# Patient Record
Sex: Male | Born: 1950
Health system: Southern US, Community
[De-identification: ages and names within clinical notes are randomized; demographics above are authoritative.]

## PROBLEM LIST (undated history)

## (undated) DIAGNOSIS — G4733 Obstructive sleep apnea (adult) (pediatric): Secondary | ICD-10-CM

## (undated) HISTORY — DX: Obstructive sleep apnea (adult) (pediatric): G47.33

---

## 2005-06-22 HISTORY — PX: COLONOSCOPY: SHX174

## 2011-06-09 ENCOUNTER — Ambulatory Visit (HOSPITAL_COMMUNITY)
Admission: EM | Admit: 2011-06-09 | Discharge: 2011-06-09 | Disposition: A | Discharge status: Home / Self Care | Payer: BC Managed Care – PPO | Attending: Internal Medicine | Admitting: Internal Medicine

## 2011-06-09 ENCOUNTER — Encounter: Payer: Self-pay | Admitting: Emergency Medicine

## 2011-06-09 ENCOUNTER — Encounter (HOSPITAL_COMMUNITY): Admission: EM | Disposition: A | Discharge status: Home / Self Care | Payer: Self-pay | Source: Home / Self Care

## 2011-06-09 DIAGNOSIS — R1310 Dysphagia, unspecified: Secondary | ICD-10-CM

## 2011-06-09 DIAGNOSIS — IMO0002 Reserved for concepts with insufficient information to code with codable children: Secondary | ICD-10-CM | POA: Insufficient documentation

## 2011-06-09 DIAGNOSIS — K222 Esophageal obstruction: Secondary | ICD-10-CM

## 2011-06-09 DIAGNOSIS — T18108A Unspecified foreign body in esophagus causing other injury, initial encounter: Secondary | ICD-10-CM

## 2011-06-09 DIAGNOSIS — K449 Diaphragmatic hernia without obstruction or gangrene: Secondary | ICD-10-CM | POA: Insufficient documentation

## 2011-06-09 HISTORY — PX: ESOPHAGOGASTRODUODENOSCOPY: SHX5428

## 2011-06-09 SURGERY — EGD (ESOPHAGOGASTRODUODENOSCOPY)
Anesthesia: Moderate Sedation

## 2011-06-09 MED ORDER — SODIUM CHLORIDE 0.9 % IV SOLN
INTRAVENOUS | Status: DC
  Administered 2011-06-09: 05:00:00 via INTRAVENOUS

## 2011-06-09 MED ORDER — GLUCAGON HCL (RDNA) 1 MG IJ SOLR
1.0000 mg | Freq: Once | INTRAMUSCULAR | Status: AC
  Administered 2011-06-09: 1 mg via INTRAVENOUS
  Filled 2011-06-09: qty 1

## 2011-06-09 MED ORDER — GLUCAGON HCL (RDNA) 1 MG IJ SOLR
INTRAMUSCULAR | Status: AC
  Administered 2011-06-09: 1 mg
  Filled 2011-06-09: qty 1

## 2011-06-09 MED ORDER — BUTAMBEN-TETRACAINE-BENZOCAINE 2-2-14 % EX AERO
INHALATION_SPRAY | CUTANEOUS | Status: DC | PRN
  Administered 2011-06-09: 2 via TOPICAL

## 2011-06-09 MED ORDER — FENTANYL NICU IV SYRINGE 50 MCG/ML
INJECTION | INTRAMUSCULAR | Status: DC | PRN
  Administered 2011-06-09 (×4): 25 ug via INTRAVENOUS

## 2011-06-09 MED ORDER — MIDAZOLAM HCL 10 MG/2ML IJ SOLN
INTRAMUSCULAR | Status: DC | PRN
  Administered 2011-06-09 (×4): 2 mg via INTRAVENOUS
  Administered 2011-06-09: 1 mg via INTRAVENOUS

## 2011-06-09 NOTE — ED Notes (Signed)
Pt states he ate steak last night for supper and felt as though it got stuck in his throat but for the past 2 hrs it has been much worse  Pt states it is hard to breathe  Pt states he had small amt of vomiting without relief

## 2011-06-09 NOTE — H&P (Signed)
  HPI: This is a very pleasant man with likely eosphageal food impaction, steak from last nights dinner at [employer]    History reviewed. No pertinent past medical history.  History reviewed. No pertinent past surgical history.  Current Facility-Administered Medications  Medication Dose Route Frequency Provider Last Rate Last Dose  . 0.9 %  sodium chloride infusion   Intravenous Continuous Flint Melter, MD 125 mL/hr at 06/09/11 0525    . glucagon (GLUCAGEN) 1 MG injection        1 mg at 06/09/11 0558  . glucagon (GLUCAGEN) injection 1 mg  1 mg Intravenous Once Flint Melter, MD   1 mg at 06/09/11 1610   Current Outpatient Prescriptions  Medication Sig Dispense Refill  . cetirizine (ZYRTEC) 10 MG tablet Take 10 mg by mouth daily.        . Multiple Vitamins-Minerals (MULTIVITAMIN WITH MINERALS) tablet Take 1 tablet by mouth daily.        Marland Kitchen omeprazole (PRILOSEC) 20 MG capsule Take 20 mg by mouth as needed.          Allergies as of 06/09/2011 - Review Complete 06/09/2011  Allergen Reaction Noted  . Amoxicillin  06/09/2011    History reviewed. No pertinent family history.  History   Social History  . Marital Status: Married    Spouse Name: N/A    Number of Children: N/A  . Years of Education: N/A   Occupational History  . Not on file.   Social History Main Topics  . Smoking status: Never Smoker   . Smokeless tobacco: Not on file  . Alcohol Use: Yes  . Drug Use: No  . Sexually Active:    Other Topics Concern  . Not on file   Social History Narrative  . No narrative on file      Physical Exam: BP 116/78  Pulse 86  Temp(Src) 97.8 F (36.6 C) (Oral)  Resp 14  SpO2 100% Constitutional: generally well-appearing Psychiatric: alert and oriented x3 Abdomen: soft, nontender, nondistended, no obvious ascites, no peritoneal signs, normal bowel sounds     Assessment and plan: 60 y.o. male with likely esophageal food impaction  He identifies Dr. Matthias Hughs  as his gastroenterologist (recent office visits) but ED called [facility] this morning.  I spoke with Dr. Evette Cristal who is fine with me proceeding with the EGD now.

## 2011-06-09 NOTE — ED Notes (Signed)
Pt transported to endo for procedure.

## 2011-06-09 NOTE — Op Note (Signed)
[facility] [address] [place], [place]  [address]  ENDOSCOPY PROCEDURE REPORT  PATIENT:  Joseph Henderson, Joseph Henderson  MR#:  [id number] BIRTHDATE:  [birth year], 60 yrs. old  GENDER:  male ENDOSCOPIST:  Rachael Fee, MD PROCEDURE DATE:  06/09/2011 PROCEDURE:  EGD with foreign body removal ASA CLASS:  Class II INDICATIONS:  food impaction, was eating steak last night at [employer]  Primary GI MD Dr. Matthias Hughs however [facility] called by ER this AM.  Dr. Christella Hartigan discussed with Dr. Evette Cristal and then proceeded with EGD MEDICATIONS:  Fentanyl 100 mcg IV, Versed 9 mg IV TOPICAL ANESTHETIC:  Cetacaine Spray  DESCRIPTION OF PROCEDURE:   After the risks benefits and alternatives of the procedure were thoroughly explained, informed consent was obtained.  The  endoscope was introduced through the mouth and advanced to the stomach body, without limitations.  The instrument was slowly withdrawn as the mucosa was fully examined. <<PROCEDUREIMAGES>> There was a large, solid bolus of steak at GE junction. I tried to gently push the bolus into his stomach without success. I then attempted to remove it with Lucina Mellow Net but this was not helpful and so I changed to four-pronged grasper and was able to removed the bolus in a single piece from the esophagus. There was a benign appearing, focal stricture at GE junction with minor superficial mucosal tear at the site. Dilation was not performed (see image2, image5, and image6).  A hiatal hernia was found (see image4 and image3).    Retroflexed views revealed no abnormalities.    The scope was then withdrawn from the patient and the procedure completed. COMPLICATIONS:  None  ENDOSCOPIC IMPRESSION: 1) Steak bolus impacted at GE junction above a benign, focal (peptic appearing) stricture.  This was removed, stricture was not dilated. 2) Hiatal hernia  RECOMMENDATIONS: Please call Dr. Donavan Burnet office for an appt to see him within the next  2-3 weeks. You should stay on TWICE daily prevacid for now (best taken 20-30 min prior to meals). You need to chew very well, eat slowly and take small bites for now.  ______________________________ Rachael Fee, MD  n. eSIGNED:   Rachael Fee at 06/09/2011 09:49 AM  [name], [phone]

## 2011-06-09 NOTE — ED Provider Notes (Signed)
History     CSN: [id number] Arrival date & time: 06/09/2011  3:43 AM   First MD Initiated Contact with Patient 06/09/11 [phone]      Chief Complaint  Patient presents with  . Airway Obstruction    (Consider location/radiation/quality/duration/timing/severity/associated sxs/prior treatment) The history is provided by the patient and the spouse.   Patient was eating steak at 7 PM last evening, when he felt something get stuck in his upper chest area. Since that time. He has been unable to eat or drink anything. He reports spitting saliva as well. His wife tried the Heimlich maneuver on him without success. He has not had any trouble breathing, cough, or color change. He has no chest pain, fever, or weakness. He takes Prilosec sporadically to help prevent problems like this. He has never had esophageal obstruction, requiring extraction. He thinks he had an endoscopy 2 years ago at the time he had a colonoscopy, but is unclear about the results.  History reviewed. No pertinent past medical history.  History reviewed. No pertinent past surgical history.  History reviewed. No pertinent family history.  History  Substance Use Topics  . Smoking status: Never Smoker   . Smokeless tobacco: Not on file  . Alcohol Use: Yes      Review of Systems  All other systems reviewed and are negative.    Allergies  Amoxicillin  Home Medications   Current Outpatient Rx  Name Route Sig Dispense Refill  . CETIRIZINE HCL 10 MG PO TABS Oral Take 10 mg by mouth daily.      . MULTI-VITAMIN/MINERALS PO TABS Oral Take 1 tablet by mouth daily.      Marland Kitchen OMEPRAZOLE 20 MG PO CPDR Oral Take 20 mg by mouth as needed.        BP 116/78  Pulse 86  Temp(Src) 97.8 F (36.6 C) (Oral)  Resp 14  SpO2 100%  Physical Exam  Constitutional: He is oriented to person, place, and time. He appears well-developed and well-nourished.       At initial evaluation, he is not spitting saliva  HENT:  Head: Normocephalic  and atraumatic.  Eyes: EOM are normal. Pupils are equal, round, and reactive to light.  Neck: Normal range of motion. Neck supple.  Cardiovascular: Normal rate and regular rhythm.   Pulmonary/Chest: Effort normal and breath sounds normal.  Abdominal: Soft. Bowel sounds are normal.  Musculoskeletal: Normal range of motion.  Neurological: He is alert and oriented to person, place, and time. No cranial nerve deficit. He exhibits normal muscle tone. Coordination normal.  Skin: Skin is warm and dry. No erythema.  Psychiatric: He has a normal mood and affect. His behavior is normal. Judgment and thought content normal.    ED Course  Procedures (including critical care time) Emergency department treatment: IV fluids. Glucagon x2. Patient did not have improvement in symptoms of foreign body sensation in his esophagus. He did began to expectorate saliva in a container so we could visualize. He did not tolerate attempts to swallow water. I contacted the on-call gastroenterologist for his physician, who agreed to evaluate the patient and remove a foreign body if present. Labs Reviewed - No data to display No results found.   1. Esophageal foreign body       MDM  Patient needs urgent endoscopy to remove an esophageal foreign body       Flint Melter, MD 06/09/11 [phone]

## 2011-06-10 ENCOUNTER — Encounter (HOSPITAL_COMMUNITY): Payer: Self-pay

## 2011-06-10 ENCOUNTER — Encounter (HOSPITAL_COMMUNITY): Payer: Self-pay | Admitting: Gastroenterology

## 2011-07-26 ENCOUNTER — Ambulatory Visit: Payer: BC Managed Care – PPO | Admitting: Family Medicine

## 2011-07-26 VITALS — BP 119/67 | HR 69 | Temp 97.9°F | Resp 16 | Ht 74.0 in | Wt 201.0 lb

## 2011-07-26 DIAGNOSIS — K219 Gastro-esophageal reflux disease without esophagitis: Secondary | ICD-10-CM | POA: Insufficient documentation

## 2011-07-26 DIAGNOSIS — T7840XA Allergy, unspecified, initial encounter: Secondary | ICD-10-CM

## 2011-07-26 MED ORDER — METHYLPREDNISOLONE ACETATE 80 MG/ML IJ SUSP
80.0000 mg | Freq: Once | INTRAMUSCULAR | Status: AC
  Administered 2011-07-26: 80 mg via INTRAMUSCULAR

## 2011-07-26 NOTE — Patient Instructions (Signed)
Poison Ivy Poison ivy is a inflammation of the skin (contact dermatitis) caused by touching the allergens on the leaves of the ivy plant following previous exposure to the plant. The rash usually appears 48 hours after exposure. The rash is usually bumps (papules) or blisters (vesicles) in a linear pattern. Depending on your own sensitivity, the rash may simply cause redness and itching, or it may also progress to blisters which may break open. These must be well cared for to prevent secondary bacterial (germ) infection, followed by scarring. Keep any open areas dry, clean, dressed, and covered with an antibacterial ointment if needed. The eyes may also get puffy. The puffiness is worst in the morning and gets better as the day progresses. This dermatitis usually heals without scarring, within 2 to 3 weeks without treatment. HOME CARE INSTRUCTIONS  Thoroughly wash with soap and water as soon as you have been exposed to poison ivy. You have about one half hour to remove the plant resin before it will cause the rash. This washing will destroy the oil or antigen on the skin that is causing, or will cause, the rash. Be sure to wash under your fingernails as any plant resin there will continue to spread the rash. Do not rub skin vigorously when washing affected area. Poison ivy cannot spread if no oil from the plant remains on your body. A rash that has progressed to weeping sores will not spread the rash unless you have not washed thoroughly. It is also important to wash any clothes you have been wearing as these may carry active allergens. The rash will return if you wear the unwashed clothing, even several days later. Avoidance of the plant in the future is the best measure. Poison ivy plant can be recognized by the number of leaves. Generally, poison ivy has three leaves with flowering branches on a single stem. Diphenhydramine may be purchased over the counter and used as needed for itching. Do not drive with  this medication if it makes you drowsy.Ask your caregiver about medication for children. SEEK MEDICAL CARE IF:  Open sores develop.   Redness spreads beyond area of rash.   You notice purulent (pus-like) discharge.   You have increased pain.   Other signs of infection develop (such as fever).  Document Released: 06/05/2000 Document Revised: 02/18/2011 Document Reviewed: 04/24/2009 ExitCare Patient Information 2012 [employer]. 

## 2011-07-26 NOTE — Progress Notes (Signed)
Joseph Henderson is a 61 year old gentleman who comes in complaining of facial swelling. He was burning brush in his backyard yesterday, and he has a history of poison ivy allergy. In concert exactly similar to what he's had in the past with an exposed to poison ivy.   Mild periorbital swelling oropharynx clear neck supple no adenopathy and chest is clear.  Assessment: Acute allergic reaction to rhus.

## 2012-03-17 ENCOUNTER — Ambulatory Visit (INDEPENDENT_AMBULATORY_CARE_PROVIDER_SITE_OTHER): Payer: BC Managed Care – PPO

## 2012-03-17 DIAGNOSIS — Z23 Encounter for immunization: Secondary | ICD-10-CM

## 2013-03-14 ENCOUNTER — Ambulatory Visit (INDEPENDENT_AMBULATORY_CARE_PROVIDER_SITE_OTHER): Payer: BC Managed Care – PPO | Admitting: Family Medicine

## 2013-03-14 DIAGNOSIS — Z23 Encounter for immunization: Secondary | ICD-10-CM

## 2014-03-05 ENCOUNTER — Ambulatory Visit (INDEPENDENT_AMBULATORY_CARE_PROVIDER_SITE_OTHER): Payer: BC Managed Care – PPO

## 2014-03-05 DIAGNOSIS — Z23 Encounter for immunization: Secondary | ICD-10-CM

## 2015-02-11 ENCOUNTER — Ambulatory Visit (INDEPENDENT_AMBULATORY_CARE_PROVIDER_SITE_OTHER): Payer: BLUE CROSS/BLUE SHIELD | Admitting: Internal Medicine

## 2015-02-11 VITALS — BP 112/68 | HR 64 | Temp 97.9°F | Resp 16 | Ht 73.0 in | Wt 211.4 lb

## 2015-02-11 DIAGNOSIS — R05 Cough: Secondary | ICD-10-CM | POA: Diagnosis not present

## 2015-02-11 DIAGNOSIS — R059 Cough, unspecified: Secondary | ICD-10-CM

## 2015-02-11 DIAGNOSIS — J22 Unspecified acute lower respiratory infection: Secondary | ICD-10-CM

## 2015-02-11 DIAGNOSIS — J988 Other specified respiratory disorders: Secondary | ICD-10-CM

## 2015-02-11 MED ORDER — AZITHROMYCIN 250 MG PO TABS: ORAL_TABLET | ORAL | Status: DC

## 2015-02-11 MED ORDER — HYDROCODONE-HOMATROPINE 5-1.5 MG/5ML PO SYRP: 5.0000 mL | ORAL_SOLUTION | Freq: Four times a day (QID) | ORAL | Status: DC | PRN

## 2015-02-11 NOTE — Progress Notes (Signed)
   Subjective:    Patient ID: Joseph Henderson, male    DOB: 09-Sep-1950, 63 y.o.   MRN: [id number]  HPI cough and chest congestion for one week and worse Productive in the morning Cough interrupts sleep No fever or no nasal congestion no sore throat Wife with the same illness and has responded to Zithromax  Leaving for [place] tomorrow    Review of Systems Noncontributory    Objective:   Physical Exam BP 112/68 mmHg  Pulse 64  Temp(Src) 97.9 F (36.6 C) (Oral)  Resp 16  Ht  (1.854 m)  Wt 211 lb 6.4 oz (95.89 kg)  BMI 27.90 kg/m2  SpO2 98% Conjunctiva clear TMs clear Nares boggy with clear rhinorrhea Throat clear No cervical lymphadenopathy Chest with scattered rhonchi bilaterally that clear with coughing No wheezing or delay in expiration with forced expiration Heart regular without murmur   Friend Joseph Henderson grape    Assessment & Plan:  Cough  Lower respiratory infection  Meds ordered this encounter  Medications  . azithromycin (ZITHROMAX) 250 MG tablet    Sig: As packaged    Dispense:  6 tablet    Refill:  0  . HYDROcodone-homatropine (HYCODAN) 5-1.5 MG/5ML syrup    Sig: Take 5 mLs by mouth every 6 (six) hours as needed.    Dispense:  80 mL    RefiMathayus Stanbery Needs less than 3 oz to fly   Afrin before flying

## 2015-03-18 ENCOUNTER — Ambulatory Visit (INDEPENDENT_AMBULATORY_CARE_PROVIDER_SITE_OTHER): Payer: BLUE CROSS/BLUE SHIELD | Admitting: *Deleted

## 2015-03-18 DIAGNOSIS — Z23 Encounter for immunization: Secondary | ICD-10-CM | POA: Diagnosis not present

## 2016-03-06 ENCOUNTER — Ambulatory Visit (INDEPENDENT_AMBULATORY_CARE_PROVIDER_SITE_OTHER): Payer: Medicare Other

## 2016-03-06 DIAGNOSIS — Z23 Encounter for immunization: Secondary | ICD-10-CM | POA: Diagnosis not present

## 2016-07-14 DIAGNOSIS — D485 Neoplasm of uncertain behavior of skin: Secondary | ICD-10-CM | POA: Diagnosis not present

## 2016-07-14 DIAGNOSIS — L570 Actinic keratosis: Secondary | ICD-10-CM | POA: Diagnosis not present

## 2016-07-14 DIAGNOSIS — D225 Melanocytic nevi of trunk: Secondary | ICD-10-CM | POA: Diagnosis not present

## 2016-07-14 DIAGNOSIS — Z86018 Personal history of other benign neoplasm: Secondary | ICD-10-CM | POA: Diagnosis not present

## 2016-07-14 DIAGNOSIS — D2262 Melanocytic nevi of left upper limb, including shoulder: Secondary | ICD-10-CM | POA: Diagnosis not present

## 2016-07-14 DIAGNOSIS — L821 Other seborrheic keratosis: Secondary | ICD-10-CM | POA: Diagnosis not present

## 2016-07-14 DIAGNOSIS — Z23 Encounter for immunization: Secondary | ICD-10-CM | POA: Diagnosis not present

## 2016-07-14 DIAGNOSIS — Z85828 Personal history of other malignant neoplasm of skin: Secondary | ICD-10-CM | POA: Diagnosis not present

## 2016-08-25 DIAGNOSIS — D485 Neoplasm of uncertain behavior of skin: Secondary | ICD-10-CM | POA: Diagnosis not present

## 2016-08-25 DIAGNOSIS — L988 Other specified disorders of the skin and subcutaneous tissue: Secondary | ICD-10-CM | POA: Diagnosis not present

## 2017-01-12 DIAGNOSIS — Z13220 Encounter for screening for lipoid disorders: Secondary | ICD-10-CM | POA: Diagnosis not present

## 2017-01-12 DIAGNOSIS — Z0000 Encounter for general adult medical examination without abnormal findings: Secondary | ICD-10-CM | POA: Diagnosis not present

## 2017-01-12 DIAGNOSIS — K219 Gastro-esophageal reflux disease without esophagitis: Secondary | ICD-10-CM | POA: Diagnosis not present

## 2017-01-12 DIAGNOSIS — Z125 Encounter for screening for malignant neoplasm of prostate: Secondary | ICD-10-CM | POA: Diagnosis not present

## 2017-01-20 DIAGNOSIS — J069 Acute upper respiratory infection, unspecified: Secondary | ICD-10-CM | POA: Diagnosis not present

## 2017-03-09 ENCOUNTER — Ambulatory Visit (INDEPENDENT_AMBULATORY_CARE_PROVIDER_SITE_OTHER): Payer: Medicare Other | Admitting: Physician Assistant

## 2017-03-09 DIAGNOSIS — Z23 Encounter for immunization: Secondary | ICD-10-CM | POA: Diagnosis not present

## 2017-03-09 NOTE — Progress Notes (Signed)
Need flu vaccine

## 2017-03-23 DIAGNOSIS — L570 Actinic keratosis: Secondary | ICD-10-CM | POA: Diagnosis not present

## 2017-03-23 DIAGNOSIS — D485 Neoplasm of uncertain behavior of skin: Secondary | ICD-10-CM | POA: Diagnosis not present

## 2017-03-23 DIAGNOSIS — Z86018 Personal history of other benign neoplasm: Secondary | ICD-10-CM | POA: Diagnosis not present

## 2017-03-23 DIAGNOSIS — C44311 Basal cell carcinoma of skin of nose: Secondary | ICD-10-CM | POA: Diagnosis not present

## 2017-03-23 DIAGNOSIS — D225 Melanocytic nevi of trunk: Secondary | ICD-10-CM | POA: Diagnosis not present

## 2017-05-17 DIAGNOSIS — C44311 Basal cell carcinoma of skin of nose: Secondary | ICD-10-CM | POA: Diagnosis not present

## 2017-05-25 DIAGNOSIS — Z1211 Encounter for screening for malignant neoplasm of colon: Secondary | ICD-10-CM | POA: Diagnosis not present

## 2018-01-20 DIAGNOSIS — Z0000 Encounter for general adult medical examination without abnormal findings: Secondary | ICD-10-CM | POA: Diagnosis not present

## 2018-01-20 DIAGNOSIS — Z23 Encounter for immunization: Secondary | ICD-10-CM | POA: Diagnosis not present

## 2018-01-20 DIAGNOSIS — Z79899 Other long term (current) drug therapy: Secondary | ICD-10-CM | POA: Diagnosis not present

## 2018-01-20 DIAGNOSIS — Z125 Encounter for screening for malignant neoplasm of prostate: Secondary | ICD-10-CM | POA: Diagnosis not present

## 2018-03-22 DIAGNOSIS — R05 Cough: Secondary | ICD-10-CM | POA: Diagnosis not present

## 2018-03-22 DIAGNOSIS — J069 Acute upper respiratory infection, unspecified: Secondary | ICD-10-CM | POA: Diagnosis not present

## 2018-03-29 DIAGNOSIS — H6123 Impacted cerumen, bilateral: Secondary | ICD-10-CM | POA: Diagnosis not present

## 2018-03-29 DIAGNOSIS — H6993 Unspecified Eustachian tube disorder, bilateral: Secondary | ICD-10-CM | POA: Diagnosis not present

## 2018-04-12 DIAGNOSIS — L02432 Carbuncle of left axilla: Secondary | ICD-10-CM | POA: Diagnosis not present

## 2018-06-02 DIAGNOSIS — Z1211 Encounter for screening for malignant neoplasm of colon: Secondary | ICD-10-CM | POA: Diagnosis not present

## 2018-06-06 DIAGNOSIS — L02432 Carbuncle of left axilla: Secondary | ICD-10-CM | POA: Diagnosis not present

## 2018-06-30 DIAGNOSIS — S0501XA Injury of conjunctiva and corneal abrasion without foreign body, right eye, initial encounter: Secondary | ICD-10-CM | POA: Diagnosis not present

## 2018-07-28 DIAGNOSIS — L0291 Cutaneous abscess, unspecified: Secondary | ICD-10-CM | POA: Diagnosis not present

## 2019-01-24 DIAGNOSIS — Z0000 Encounter for general adult medical examination without abnormal findings: Secondary | ICD-10-CM | POA: Diagnosis not present

## 2019-01-24 DIAGNOSIS — Z125 Encounter for screening for malignant neoplasm of prostate: Secondary | ICD-10-CM | POA: Diagnosis not present

## 2019-01-24 DIAGNOSIS — Z13220 Encounter for screening for lipoid disorders: Secondary | ICD-10-CM | POA: Diagnosis not present

## 2019-01-31 DIAGNOSIS — H524 Presbyopia: Secondary | ICD-10-CM | POA: Diagnosis not present

## 2019-02-17 DIAGNOSIS — Z23 Encounter for immunization: Secondary | ICD-10-CM | POA: Diagnosis not present

## 2019-02-17 DIAGNOSIS — Z125 Encounter for screening for malignant neoplasm of prostate: Secondary | ICD-10-CM | POA: Diagnosis not present

## 2019-02-17 DIAGNOSIS — Z0000 Encounter for general adult medical examination without abnormal findings: Secondary | ICD-10-CM | POA: Diagnosis not present

## 2019-02-17 DIAGNOSIS — Z13220 Encounter for screening for lipoid disorders: Secondary | ICD-10-CM | POA: Diagnosis not present

## 2019-04-07 DIAGNOSIS — L570 Actinic keratosis: Secondary | ICD-10-CM | POA: Diagnosis not present

## 2019-04-07 DIAGNOSIS — Z23 Encounter for immunization: Secondary | ICD-10-CM | POA: Diagnosis not present

## 2019-04-07 DIAGNOSIS — D485 Neoplasm of uncertain behavior of skin: Secondary | ICD-10-CM | POA: Diagnosis not present

## 2019-04-07 DIAGNOSIS — D225 Melanocytic nevi of trunk: Secondary | ICD-10-CM | POA: Diagnosis not present

## 2019-07-12 ENCOUNTER — Ambulatory Visit: Payer: Medicare Other | Attending: Internal Medicine

## 2019-07-12 DIAGNOSIS — Z23 Encounter for immunization: Secondary | ICD-10-CM | POA: Insufficient documentation

## 2019-07-12 NOTE — Progress Notes (Signed)
   Covid-19 Vaccination Clinic  Name:  Nasim Habeeb    MRN: [id number] DOB: 1951/05/08  07/12/2019  Mr. Klaus was observed post Covid-19 immunization for 15 minutes without incidence. He was provided with Vaccine Information Sheet and instruction to access the V-Safe system.   Mr. Clason was instructed to call 911 with any severe reactions post vaccine: Marland Kitchen Difficulty breathing  . Swelling of your face and throat  . A fast heartbeat  . A bad rash all over your body  . Dizziness and weakness    Immunizations Administered    Name Date Dose VIS Date Route   Pfizer COVID-19 Vaccine 07/12/2019  5:00 PM 0.3 mL 06/02/2019 Intramuscular   Manufacturer: [employer], [employer]   Lot: [id number]   NDC: [address]-6

## 2019-07-31 ENCOUNTER — Ambulatory Visit: Payer: Medicare Other | Attending: Internal Medicine

## 2019-07-31 DIAGNOSIS — Z23 Encounter for immunization: Secondary | ICD-10-CM | POA: Insufficient documentation

## 2019-07-31 NOTE — Progress Notes (Signed)
   Covid-19 Vaccination Clinic  Name:  Joseph Henderson    MRN: [id number] DOB: 1951-01-24  07/31/2019  Mr. Joseph Henderson was observed post Covid-19 immunization for 15 minutes without incidence. He was provided with Vaccine Information Sheet and instruction to access the V-Safe system.   Mr. Joseph Henderson was instructed to call 911 with any severe reactions post vaccine: Marland Kitchen Difficulty breathing  . Swelling of your face and throat  . A fast heartbeat  . A bad rash all over your body  . Dizziness and weakness    Immunizations Administered    Name Date Dose VIS Date Route   Pfizer COVID-19 Vaccine 07/31/2019  8:33 AM 0.3 mL 06/02/2019 Intramuscular   Manufacturer: [employer], [employer]   Lot: [id number]   NDC: [address]-3

## 2019-08-01 ENCOUNTER — Ambulatory Visit: Payer: Medicare Other

## 2019-08-02 DIAGNOSIS — Z1211 Encounter for screening for malignant neoplasm of colon: Secondary | ICD-10-CM | POA: Diagnosis not present

## 2019-08-15 ENCOUNTER — Ambulatory Visit: Payer: Self-pay

## 2019-08-24 DIAGNOSIS — N5089 Other specified disorders of the male genital organs: Secondary | ICD-10-CM | POA: Diagnosis not present

## 2019-08-25 ENCOUNTER — Other Ambulatory Visit: Payer: Self-pay | Admitting: Family Medicine

## 2019-08-25 DIAGNOSIS — N5089 Other specified disorders of the male genital organs: Secondary | ICD-10-CM

## 2019-08-29 DIAGNOSIS — Z0120 Encounter for dental examination and cleaning without abnormal findings: Secondary | ICD-10-CM | POA: Diagnosis not present

## 2019-08-30 ENCOUNTER — Ambulatory Visit
Admission: RE | Admit: 2019-08-30 | Discharge: 2019-08-30 | Disposition: A | Discharge status: Home / Self Care | Payer: Medicare Other | Source: Ambulatory Visit | Attending: Family Medicine | Admitting: Family Medicine

## 2019-08-30 DIAGNOSIS — N503 Cyst of epididymis: Secondary | ICD-10-CM | POA: Diagnosis not present

## 2019-08-30 DIAGNOSIS — N433 Hydrocele, unspecified: Secondary | ICD-10-CM | POA: Diagnosis not present

## 2019-08-30 DIAGNOSIS — N5089 Other specified disorders of the male genital organs: Secondary | ICD-10-CM

## 2019-10-12 DIAGNOSIS — Z86018 Personal history of other benign neoplasm: Secondary | ICD-10-CM | POA: Diagnosis not present

## 2019-10-12 DIAGNOSIS — C4442 Squamous cell carcinoma of skin of scalp and neck: Secondary | ICD-10-CM | POA: Diagnosis not present

## 2019-10-12 DIAGNOSIS — D225 Melanocytic nevi of trunk: Secondary | ICD-10-CM | POA: Diagnosis not present

## 2019-10-12 DIAGNOSIS — D2272 Melanocytic nevi of left lower limb, including hip: Secondary | ICD-10-CM | POA: Diagnosis not present

## 2019-10-12 DIAGNOSIS — C44519 Basal cell carcinoma of skin of other part of trunk: Secondary | ICD-10-CM | POA: Diagnosis not present

## 2019-10-12 DIAGNOSIS — L578 Other skin changes due to chronic exposure to nonionizing radiation: Secondary | ICD-10-CM | POA: Diagnosis not present

## 2019-10-12 DIAGNOSIS — Z85828 Personal history of other malignant neoplasm of skin: Secondary | ICD-10-CM | POA: Diagnosis not present

## 2019-11-02 DIAGNOSIS — L905 Scar conditions and fibrosis of skin: Secondary | ICD-10-CM | POA: Diagnosis not present

## 2019-11-02 DIAGNOSIS — C4442 Squamous cell carcinoma of skin of scalp and neck: Secondary | ICD-10-CM | POA: Diagnosis not present

## 2019-11-21 DIAGNOSIS — D485 Neoplasm of uncertain behavior of skin: Secondary | ICD-10-CM | POA: Diagnosis not present

## 2019-11-21 DIAGNOSIS — L988 Other specified disorders of the skin and subcutaneous tissue: Secondary | ICD-10-CM | POA: Diagnosis not present

## 2019-11-21 DIAGNOSIS — L7682 Other postprocedural complications of skin and subcutaneous tissue: Secondary | ICD-10-CM | POA: Diagnosis not present

## 2020-01-22 DIAGNOSIS — Z125 Encounter for screening for malignant neoplasm of prostate: Secondary | ICD-10-CM | POA: Diagnosis not present

## 2020-01-22 DIAGNOSIS — Z0000 Encounter for general adult medical examination without abnormal findings: Secondary | ICD-10-CM | POA: Diagnosis not present

## 2020-01-22 DIAGNOSIS — Z1211 Encounter for screening for malignant neoplasm of colon: Secondary | ICD-10-CM | POA: Diagnosis not present

## 2020-01-26 DIAGNOSIS — G5601 Carpal tunnel syndrome, right upper limb: Secondary | ICD-10-CM | POA: Diagnosis not present

## 2020-01-26 DIAGNOSIS — L729 Follicular cyst of the skin and subcutaneous tissue, unspecified: Secondary | ICD-10-CM | POA: Diagnosis not present

## 2020-01-26 DIAGNOSIS — Z0000 Encounter for general adult medical examination without abnormal findings: Secondary | ICD-10-CM | POA: Diagnosis not present

## 2020-01-26 DIAGNOSIS — M79675 Pain in left toe(s): Secondary | ICD-10-CM | POA: Diagnosis not present

## 2020-02-12 ENCOUNTER — Ambulatory Visit: Payer: Medicare Other | Admitting: Podiatry

## 2020-02-13 ENCOUNTER — Ambulatory Visit (INDEPENDENT_AMBULATORY_CARE_PROVIDER_SITE_OTHER): Payer: Medicare Other

## 2020-02-13 ENCOUNTER — Other Ambulatory Visit: Payer: Self-pay

## 2020-02-13 ENCOUNTER — Ambulatory Visit: Payer: Medicare Other | Admitting: Podiatry

## 2020-02-13 ENCOUNTER — Encounter: Payer: Self-pay | Admitting: Podiatry

## 2020-02-13 DIAGNOSIS — M2022 Hallux rigidus, left foot: Secondary | ICD-10-CM | POA: Diagnosis not present

## 2020-02-13 DIAGNOSIS — M2062 Acquired deformities of toe(s), unspecified, left foot: Secondary | ICD-10-CM | POA: Diagnosis not present

## 2020-02-13 DIAGNOSIS — M779 Enthesopathy, unspecified: Secondary | ICD-10-CM

## 2020-02-13 DIAGNOSIS — M79672 Pain in left foot: Secondary | ICD-10-CM

## 2020-02-13 MED ORDER — MELOXICAM 15 MG PO TABS: 15.0000 mg | ORAL_TABLET | Freq: Every day | ORAL | 0 refills | Status: AC

## 2020-02-13 NOTE — Patient Instructions (Signed)
Meloxicam tablets What is this medicine? MELOXICAM (mel OX i cam) is a non-steroidal anti-inflammatory drug (NSAID). It is used to reduce swelling and to treat pain. It may be used for osteoarthritis, rheumatoid arthritis, or juvenile rheumatoid arthritis. This medicine may be used for other purposes; ask your health care provider or pharmacist if you have questions. COMMON BRAND NAME(S): Mobic What should I tell my health care provider before I take this medicine? They need to know if you have any of these conditions:  bleeding disorders  cigarette smoker  coronary artery bypass graft (CABG) surgery within the past 2 weeks  drink more than 3 alcohol-containing drinks per day  heart disease  high blood pressure  history of stomach bleeding  kidney disease  liver disease  lung or breathing disease, like asthma  stomach or intestine problems  an unusual or allergic reaction to meloxicam, aspirin, other NSAIDs, other medicines, foods, dyes, or preservatives  pregnant or trying to get pregnant  breast-feeding How should I use this medicine? Take this medicine by mouth with a full glass of water. Follow the directions on the prescription label. You can take it with or without food. If it upsets your stomach, take it with food. Take your medicine at regular intervals. Do not take it more often than directed. Do not stop taking except on your doctor's advice. A special MedGuide will be given to you by the pharmacist with each prescription and refill. Be sure to read this information carefully each time. Talk to your pediatrician regarding the use of this medicine in children. While this drug may be prescribed for selected conditions, precautions do apply. Patients over 65 years old may have a stronger reaction and need a smaller dose. Overdosage: If you think you have taken too much of this medicine contact a poison control center or emergency room at once. NOTE: This medicine is  only for you. Do not share this medicine with others. What if I miss a dose? If you miss a dose, take it as soon as you can. If it is almost time for your next dose, take only that dose. Do not take double or extra doses. What may interact with this medicine? Do not take this medicine with any of the following medications:  cidofovir  ketorolac This medicine may also interact with the following medications:  aspirin and aspirin-like medicines  certain medicines for blood pressure, heart disease, irregular heart beat  certain medicines for depression, anxiety, or psychotic disturbances  certain medicines that treat or prevent blood clots like warfarin, enoxaparin, dalteparin, apixaban, dabigatran, rivaroxaban  cyclosporine  diuretics  fluconazole  lithium  methotrexate  other NSAIDs, medicines for pain and inflammation, like ibuprofen and naproxen  pemetrexed This list may not describe all possible interactions. Give your health care provider a list of all the medicines, herbs, non-prescription drugs, or dietary supplements you use. Also tell them if you smoke, drink alcohol, or use illegal drugs. Some items may interact with your medicine. What should I watch for while using this medicine? Tell your doctor or healthcare provider if your symptoms do not start to get better or if they get worse. This medicine may cause serious skin reactions. They can happen weeks to months after starting the medicine. Contact your healthcare provider right away if you notice fevers or flu-like symptoms with a rash. The rash may be red or purple and then turn into blisters or peeling of the skin. Or, you might notice a red rash with   swelling of the face, lips or lymph nodes in your neck or under your arms. Do not take other medicines that contain aspirin, ibuprofen, or naproxen with this medicine. Side effects such as stomach upset, nausea, or ulcers may be more likely to occur. Many medicines  available without a prescription should not be taken with this medicine. This medicine can cause ulcers and bleeding in the stomach and intestines at any time during treatment. This can happen with no warning and may cause death. There is increased risk with taking this medicine for a long time. Smoking, drinking alcohol, older age, and poor health can also increase risks. Call your doctor right away if you have stomach pain or blood in your vomit or stool. This medicine does not prevent heart attack or stroke. In fact, this medicine may increase the chance of a heart attack or stroke. The chance may increase with longer use of this medicine and in people who have heart disease. If you take aspirin to prevent heart attack or stroke, talk with your doctor or healthcare provider. What side effects may I notice from receiving this medicine? Side effects that you should report to your doctor or health care professional as soon as possible:  allergic reactions like skin rash, itching or hives, swelling of the face, lips, or tongue  nausea, vomiting  redness, blistering, peeling, or loosening of the skin, including inside the mouth  signs and symptoms of a blood clot such as breathing problems; changes in vision; chest pain; severe, sudden headache; pain, swelling, warmth in the leg; trouble speaking; sudden numbness or weakness of the face, arm, or leg  signs and symptoms of bleeding such as bloody or black, tarry stools; red or dark-brown urine; spitting up blood or brown material that looks like coffee grounds; red spots on the skin; unusual bruising or bleeding from the eye, gums, or nose  signs and symptoms of liver injury like dark yellow or brown urine; general ill feeling or flu-like symptoms; light-colored stools; loss of appetite; nausea; right upper belly pain; unusually weak or tired; yellowing of the eyes or skin  signs and symptoms of stroke like changes in vision; confusion; trouble  speaking or understanding; severe headaches; sudden numbness or weakness of the face, arm, or leg; trouble walking; dizziness; loss of balance or coordination Side effects that usually do not require medical attention (report to your doctor or health care professional if they continue or are bothersome):  constipation  diarrhea  gas This list may not describe all possible side effects. Call your doctor for medical advice about side effects. You may report side effects to FDA at 1-800-FDA-1088. Where should I keep my medicine? Keep out of the reach of children. Store at room temperature between 15 and 30 degrees C (59 and 86 degrees F). Throw away any unused medicine after the expiration date. NOTE: This sheet is a summary. It may not cover all possible information. If you have questions about this medicine, talk to your doctor, pharmacist, or health care provider.  2020 Elsevier/Gold Standard (2018-09-07 11:21:28)  

## 2020-02-18 NOTE — Progress Notes (Signed)
Subjective:   Patient ID: Joseph Henderson, male   DOB: 69 y.o.   MRN: [id number]   HPI 69 year old male presents the office today for concerns of a bunion, pain to his left foot which is been worsening over the last 1 year..  This is mostly on the big toe joint.  He states that he gets some occasional swelling to the area but no redness or warmth.  He said no recent treatment no recent injury.  No other concerns today.   Review of Systems  All other systems reviewed and are negative.  History reviewed. No pertinent past medical history.  Past Surgical History:  Procedure Laterality Date  . COLONOSCOPY  2007  . ESOPHAGOGASTRODUODENOSCOPY  06/09/2011   Procedure: ESOPHAGOGASTRODUODENOSCOPY (EGD);  Surgeon: Rob Bunting, MD;  Location: [facility];  Service: Endoscopy;  Laterality: N/A;     Current Outpatient Medications:  .  meloxicam (MOBIC) 15 MG tablet, Take 1 tablet (15 mg total) by mouth daily., Disp: 30 tablet, Rfl: 0 .  omeprazole (PRILOSEC) 20 MG capsule, Take 20 mg by mouth as needed.  , Disp: , Rfl:   Allergies  Allergen Reactions  . Amoxicillin         Objective:  Physical Exam  General: AAO x3, NAD  Dermatological: Skin is warm, dry and supple bilateral.  There are no open sores, no preulcerative lesions, no rash or signs of infection present.  Vascular: Dorsalis Pedis artery and Posterior Tibial artery pedal pulses are 2/4 bilateral with immedate capillary fill time. There is no pain with calf compression, swelling, warmth, erythema.   Neruologic: Grossly intact via light touch bilateral.   Musculoskeletal: There is decreased range of motion of the first MPJ left side worse than right.  Crepitation with MPJ range of motion and there is trace edema there is no erythema or warmth.  No other areas of discomfort identified at this time.  Muscular strength 5/5 in all groups tested bilateral.  Gait: Unassisted, Nonantalgic.       Assessment:   69 year old male  hallux rigidus, capsulitis    Plan:  -Treatment options discussed including all alternatives, risks, and complications -Etiology of symptoms were discussed -X-rays were obtained and reviewed with the patient.  Arthritic changes present of the first MPJ.  There is no evidence of acute fracture. -We discussed with conservative as well as surgical treatment options.  This time is now continue with conservative treatment.  We will order a graphite insert.  Discussed that because some orthotic with a Morton's extension.  Discussed wearing stiffer soled shoe.  Discussed steroid injection today.  Anti-inflammatories as needed. Mobic prescribed.  -Discussed arthrodesis of the first MPJ if he elects to proceed with surgical intervention.  Vivi Barrack DPM

## 2020-02-29 ENCOUNTER — Telehealth: Payer: Self-pay | Admitting: Podiatry

## 2020-02-29 NOTE — Telephone Encounter (Signed)
Pt called stating he was to be getting a graphite insert for his shoes but they did not have his size when he was in. He wears a 13 in regular shoes and 14 in gym shoes. I do see the graphite inserts in 14 back there but no size 13. Should he get it for the gym shoes or for the regular shoes? Does he need an appt or just pick up the graphite insert?

## 2020-03-05 NOTE — Telephone Encounter (Signed)
If we have a size 14 for his gym shoes we can try that. I think lisa was trying to get a size 13. Can someone call [facility] or [facility] and see if they have a size 13 that way he can use that in both? Thanks.

## 2020-03-12 ENCOUNTER — Ambulatory Visit: Payer: Medicare Other | Admitting: Sports Medicine

## 2020-03-29 ENCOUNTER — Ambulatory Visit: Payer: Self-pay

## 2020-04-08 NOTE — Telephone Encounter (Signed)
No thanks  

## 2020-04-25 DIAGNOSIS — Z86018 Personal history of other benign neoplasm: Secondary | ICD-10-CM | POA: Diagnosis not present

## 2020-04-25 DIAGNOSIS — Z85828 Personal history of other malignant neoplasm of skin: Secondary | ICD-10-CM | POA: Diagnosis not present

## 2020-04-25 DIAGNOSIS — D2272 Melanocytic nevi of left lower limb, including hip: Secondary | ICD-10-CM | POA: Diagnosis not present

## 2020-04-25 DIAGNOSIS — D225 Melanocytic nevi of trunk: Secondary | ICD-10-CM | POA: Diagnosis not present

## 2020-09-06 DIAGNOSIS — Z1211 Encounter for screening for malignant neoplasm of colon: Secondary | ICD-10-CM | POA: Diagnosis not present

## 2020-11-04 DIAGNOSIS — R1319 Other dysphagia: Secondary | ICD-10-CM | POA: Diagnosis not present

## 2020-11-04 DIAGNOSIS — K222 Esophageal obstruction: Secondary | ICD-10-CM | POA: Diagnosis not present

## 2020-11-05 DIAGNOSIS — K222 Esophageal obstruction: Secondary | ICD-10-CM | POA: Diagnosis not present

## 2020-11-23 DIAGNOSIS — R059 Cough, unspecified: Secondary | ICD-10-CM | POA: Diagnosis not present

## 2020-11-23 DIAGNOSIS — U071 COVID-19: Secondary | ICD-10-CM | POA: Diagnosis not present

## 2020-11-23 DIAGNOSIS — J029 Acute pharyngitis, unspecified: Secondary | ICD-10-CM | POA: Diagnosis not present

## 2021-01-07 DIAGNOSIS — R0981 Nasal congestion: Secondary | ICD-10-CM | POA: Diagnosis not present

## 2021-01-07 DIAGNOSIS — R051 Acute cough: Secondary | ICD-10-CM | POA: Diagnosis not present

## 2021-01-30 DIAGNOSIS — L905 Scar conditions and fibrosis of skin: Secondary | ICD-10-CM | POA: Diagnosis not present

## 2021-01-30 DIAGNOSIS — C44612 Basal cell carcinoma of skin of right upper limb, including shoulder: Secondary | ICD-10-CM | POA: Diagnosis not present

## 2021-01-30 DIAGNOSIS — Z0000 Encounter for general adult medical examination without abnormal findings: Secondary | ICD-10-CM | POA: Diagnosis not present

## 2021-01-30 DIAGNOSIS — K219 Gastro-esophageal reflux disease without esophagitis: Secondary | ICD-10-CM | POA: Diagnosis not present

## 2021-01-30 DIAGNOSIS — L821 Other seborrheic keratosis: Secondary | ICD-10-CM | POA: Diagnosis not present

## 2021-01-30 DIAGNOSIS — Z79899 Other long term (current) drug therapy: Secondary | ICD-10-CM | POA: Diagnosis not present

## 2021-01-30 DIAGNOSIS — Z125 Encounter for screening for malignant neoplasm of prostate: Secondary | ICD-10-CM | POA: Diagnosis not present

## 2021-01-30 DIAGNOSIS — C44619 Basal cell carcinoma of skin of left upper limb, including shoulder: Secondary | ICD-10-CM | POA: Diagnosis not present

## 2021-01-30 DIAGNOSIS — L570 Actinic keratosis: Secondary | ICD-10-CM | POA: Diagnosis not present

## 2021-01-30 DIAGNOSIS — K222 Esophageal obstruction: Secondary | ICD-10-CM | POA: Diagnosis not present

## 2021-01-30 DIAGNOSIS — Z85828 Personal history of other malignant neoplasm of skin: Secondary | ICD-10-CM | POA: Diagnosis not present

## 2021-04-07 DIAGNOSIS — C44612 Basal cell carcinoma of skin of right upper limb, including shoulder: Secondary | ICD-10-CM | POA: Diagnosis not present

## 2021-04-07 DIAGNOSIS — C44619 Basal cell carcinoma of skin of left upper limb, including shoulder: Secondary | ICD-10-CM | POA: Diagnosis not present

## 2021-04-07 DIAGNOSIS — L989 Disorder of the skin and subcutaneous tissue, unspecified: Secondary | ICD-10-CM | POA: Diagnosis not present

## 2021-04-07 DIAGNOSIS — L821 Other seborrheic keratosis: Secondary | ICD-10-CM | POA: Diagnosis not present

## 2021-04-15 DIAGNOSIS — C44619 Basal cell carcinoma of skin of left upper limb, including shoulder: Secondary | ICD-10-CM | POA: Diagnosis not present

## 2021-05-08 DIAGNOSIS — Z20822 Contact with and (suspected) exposure to covid-19: Secondary | ICD-10-CM | POA: Diagnosis not present

## 2021-05-08 DIAGNOSIS — R059 Cough, unspecified: Secondary | ICD-10-CM | POA: Diagnosis not present

## 2021-05-12 DIAGNOSIS — J329 Chronic sinusitis, unspecified: Secondary | ICD-10-CM | POA: Diagnosis not present

## 2021-05-13 DIAGNOSIS — H6983 Other specified disorders of Eustachian tube, bilateral: Secondary | ICD-10-CM | POA: Diagnosis not present

## 2021-05-14 DIAGNOSIS — T700XXA Otitic barotrauma, initial encounter: Secondary | ICD-10-CM | POA: Diagnosis not present

## 2021-05-14 DIAGNOSIS — H938X3 Other specified disorders of ear, bilateral: Secondary | ICD-10-CM | POA: Diagnosis not present

## 2021-05-28 DIAGNOSIS — T700XXD Otitic barotrauma, subsequent encounter: Secondary | ICD-10-CM | POA: Diagnosis not present

## 2021-06-17 DIAGNOSIS — R42 Dizziness and giddiness: Secondary | ICD-10-CM | POA: Diagnosis not present

## 2021-06-17 DIAGNOSIS — R000 Tachycardia, unspecified: Secondary | ICD-10-CM | POA: Diagnosis not present

## 2021-07-04 DIAGNOSIS — U071 COVID-19: Secondary | ICD-10-CM | POA: Diagnosis not present

## 2021-07-11 DIAGNOSIS — T700XXD Otitic barotrauma, subsequent encounter: Secondary | ICD-10-CM | POA: Diagnosis not present

## 2021-07-11 DIAGNOSIS — H903 Sensorineural hearing loss, bilateral: Secondary | ICD-10-CM | POA: Diagnosis not present

## 2021-08-01 DIAGNOSIS — H903 Sensorineural hearing loss, bilateral: Secondary | ICD-10-CM | POA: Diagnosis not present

## 2021-08-01 DIAGNOSIS — T700XXD Otitic barotrauma, subsequent encounter: Secondary | ICD-10-CM | POA: Diagnosis not present

## 2021-08-01 DIAGNOSIS — K219 Gastro-esophageal reflux disease without esophagitis: Secondary | ICD-10-CM | POA: Diagnosis not present

## 2021-08-01 DIAGNOSIS — J329 Chronic sinusitis, unspecified: Secondary | ICD-10-CM | POA: Diagnosis not present

## 2021-08-02 IMAGING — US US SCROTUM W/ DOPPLER COMPLETE
1 series · 13 of 25 positions shown · non-contrast
Comparison: None.

CLINICAL DATA: Left scrotal mass.

EXAM:
SCROTAL ULTRASOUND
DOPPLER ULTRASOUND OF THE TESTICLES
TECHNIQUE: Complete ultrasound examination of the testicles, epididymis, and
other scrotal structures was performed. Color and spectral Doppler
ultrasound were also utilized to evaluate blood flow to the
testicles.

[Series 1: us scrotum w/ doppler complete · 0.08mm/px · 13 of 96 slices shown]
[im 1/96]
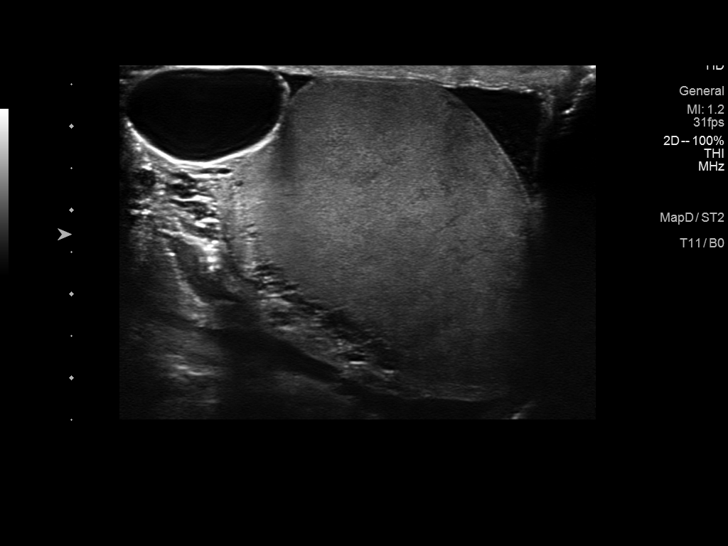
[im 8/96]
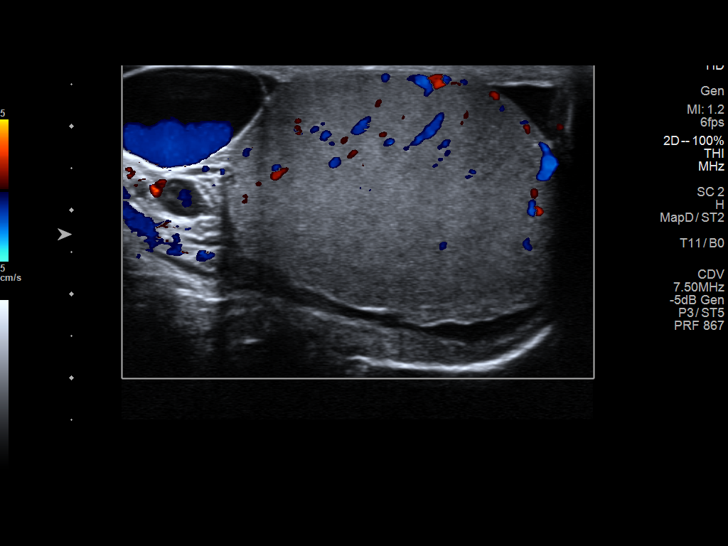
[im 16/96]
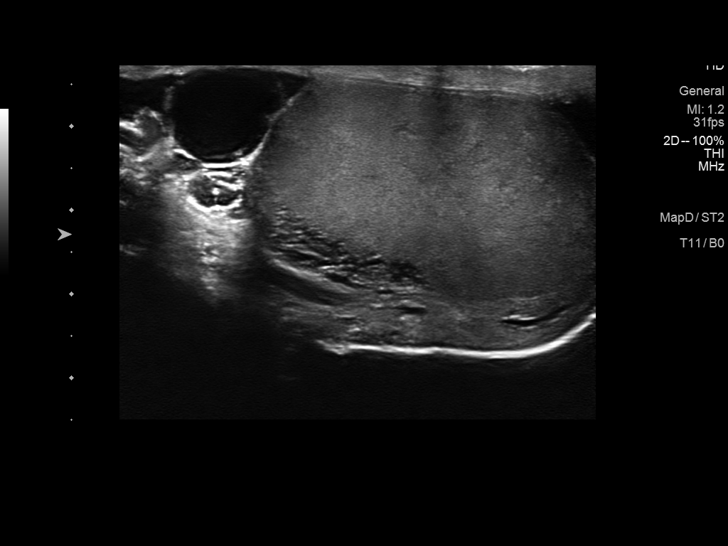
[im 24/96]
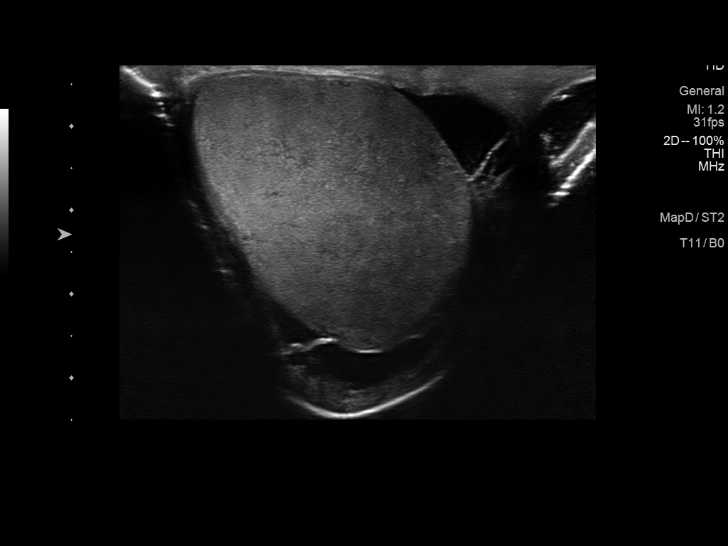
[im 32/96]
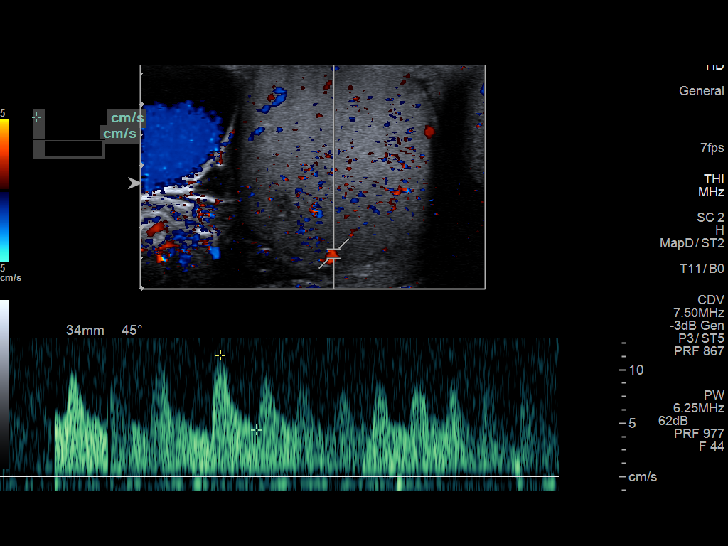
[im 40/96]
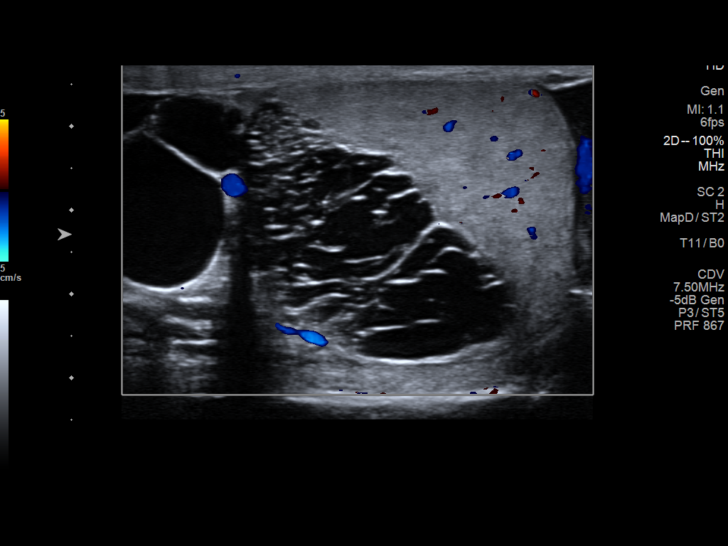
[im 48/96]
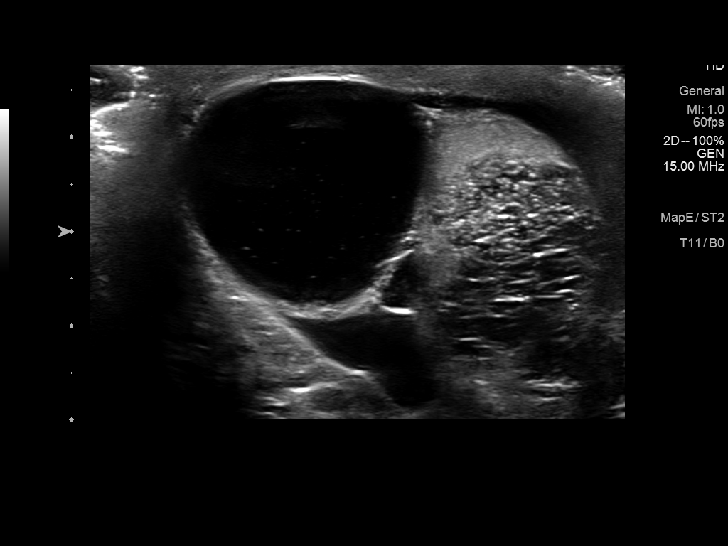
[im 56/96]
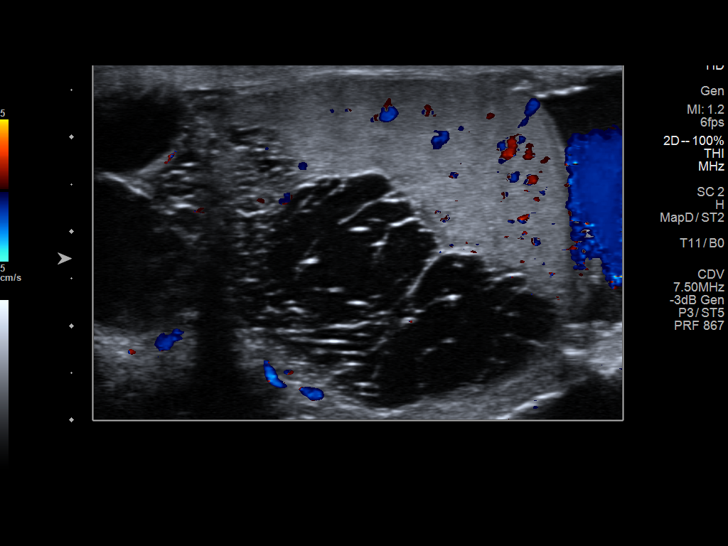
[im 64/96]
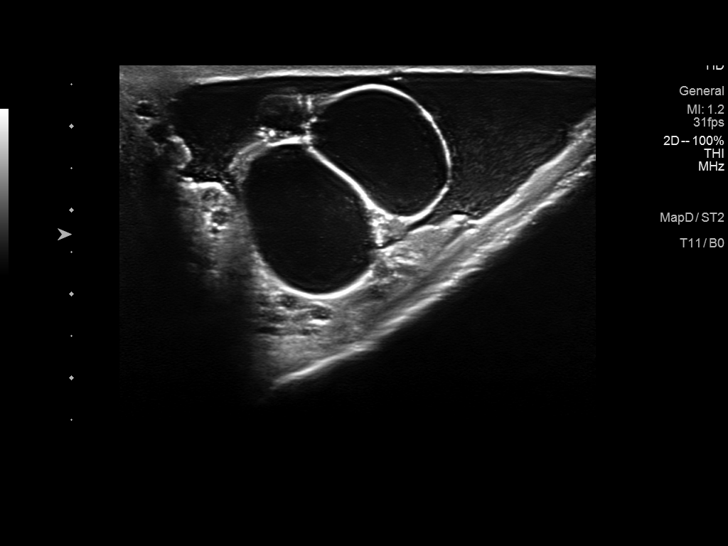
[im 72/96]
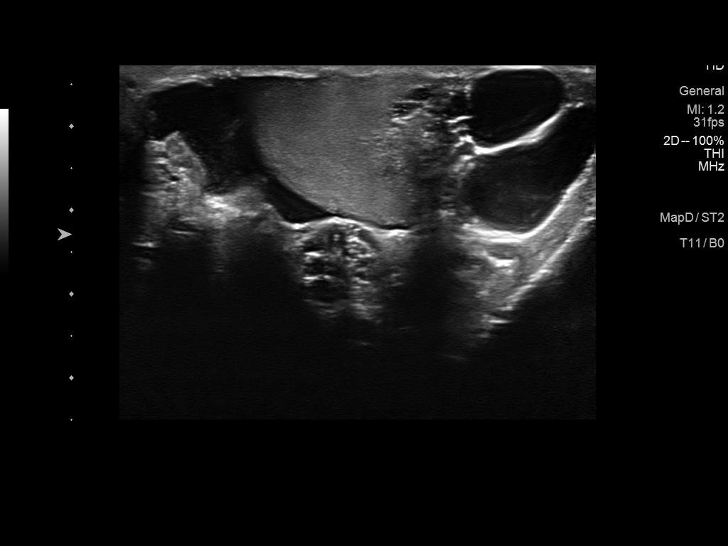
[im 80/96]
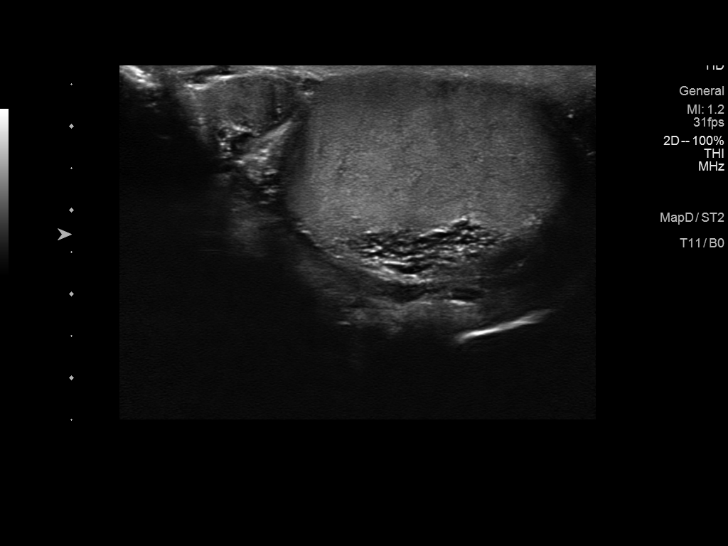
[im 88/96]
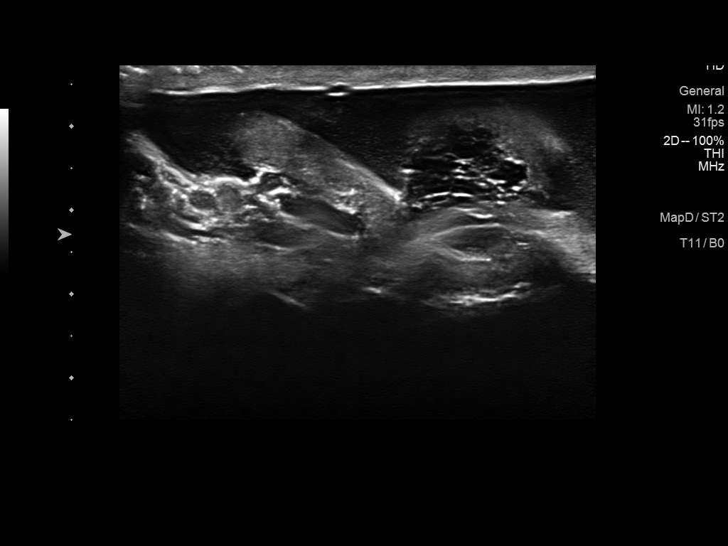
[im 96/96]
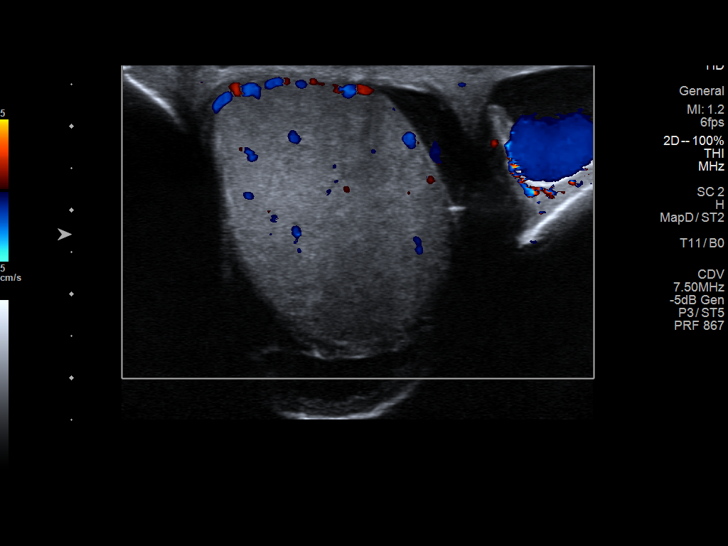

[13 of 25 positions shown; findings below may reference images not displayed]

FINDINGS: Right testicle

Measurements: 4.3 x 3.0 x 3.2 cm. Tubular ectasia of the rete
testis. No mass.

Left testicle

Measurements: 4.7 x 3.5 x 4.4 cm. Extensive tubular ectasia of the
rete testis. No mass.

Right epididymis: Cysts in the region of the epididymal head
measuring up to 2.3 x 2.3 x 2.0 cm.

Left epididymis: Cysts in the region of the epididymal head
including a septated cyst measuring 4.9 x 3.4 x 2.4 cm.

Hydrocele: Moderate left hydrocele containing debris. Small right
hydrocele.

Varicocele:  None visualized.

Pulsed Doppler interrogation of both testes demonstrates normal low
resistance arterial and venous waveforms bilaterally.
IMPRESSION: 1. Multiple cysts in the region of the epididymal heads bilaterally
likely reflecting spermatoceles and with the largest measuring
cm on the left.
2. Tubular ectasia of the rete testis bilaterally.
3. Moderate left and small right hydroceles.

## 2021-08-03 NOTE — Progress Notes (Signed)
Cardiology Office Note:    Date:  08/06/2021   ID:  Joseph Henderson, DOB 1950-12-22, MRN [id number]  PCP:  Ileana Ladd, MD  Cardiologist:  None   Referring MD: Soundra Pilon, FNP   Chief Complaint  Patient presents with   Irregular Heart Beat   Advice Only    Concerned about the possibility of underlying heart trouble or rhythm disturbance    History of Present Illness:    Joseph Henderson is a 71 y.o. male with a hx of dizziness and tachycardia.  He is doing well.  He is here because he wants to make sure his heart is okay.  Several people have recommended me he has a cardiologist.  He became concerned because after a trip to [place] he developed nasal congestion, tinnitus, and dizziness.  He was seen by ENT and was placed on prednisone.  After being on prednisone for a period of time he has wearable digital device registered heart rates greater than 140 for less than 5 minutes on 1 occasion.  He was asymptomatic when this occurred.  Because of the increase in heart rate he decided to request a cardiology evaluation.  He has never smoked.  He is physically active and does not experience exertional discomfort.  He denies angina.  He walks 16-minute miles without limitations.  He did 3 miles yesterday.  Has never had syncope.  His maternal grand father had an MI as did his great uncle.  He is not diabetic.  No history of hypertension or hyperlipidemia.  He does drink alcohol.   History reviewed. No pertinent past medical history.  Past Surgical History:  Procedure Laterality Date   COLONOSCOPY  2007   ESOPHAGOGASTRODUODENOSCOPY  06/09/2011   Procedure: ESOPHAGOGASTRODUODENOSCOPY (EGD);  Surgeon: Rob Bunting, MD;  Location: [facility];  Service: Endoscopy;  Laterality: N/A;    Current Medications: Current Meds  Medication Sig   Cetirizine HCl (ZYRTEC ALLERGY) 10 MG CAPS Take 1 capsule by mouth daily.   clindamycin (CLEOCIN) 300 MG capsule Take 300 mg by mouth 3 (three) times  daily.   fluticasone (FLONASE) 50 MCG/ACT nasal spray Place 2 sprays into both nostrils as needed.   Glucosamine 500 MG CAPS Take 1 capsule by mouth daily.   Multiple Vitamin (MULTIVITAMIN ADULT) TABS daily.   omeprazole (PRILOSEC) 20 MG capsule Take 20 mg by mouth as needed.     OVER THE COUNTER MEDICATION Flavonoid Pt takes for ringing in the ear.     Allergies:   Amoxicillin   Social History   Socioeconomic History   Marital status: Married    Spouse name: Not on file   Number of children: Not on file   Years of education: Not on file   Highest education level: Not on file  Occupational History   Not on file  Tobacco Use   Smoking status: Never   Smokeless tobacco: Never  Substance and Sexual Activity   Alcohol use: Yes    Alcohol/week: 8.0 standard drinks    Types: 8 Glasses of wine per week   Drug use: No   Sexual activity: Not on file  Other Topics Concern   Not on file  Social History Narrative   Not on file   Social Determinants of Health   Financial Resource Strain: Not on file  Food Insecurity: Not on file  Transportation Needs: Not on file  Physical Activity: Not on file  Stress: Not on file  Social Connections: Not on file  Family History: The patient's family history is not on file.  ROS:   Please see the history of present illness.    No chronic medical illnesses that he is aware of.  Has tinnitus since the [place] trip in December.  He is now off prednisone.  All other systems reviewed and are negative.  EKGs/Labs/Other Studies Reviewed:    The following studies were reviewed today: No prior cardiac work-up.  EKG:  EKG performed on June 17, 2021 at [facility] demonstrated normal sinus rhythm with normal overall EKG appearance.  No abnormality noted.  Recent Labs: No results found for requested labs within last 8760 hours.  Recent Lipid Panel No results found for: CHOL, TRIG, HDL, CHOLHDL, VLDL, LDLCALC, LDLDIRECT  Physical Exam:    VS:   BP 122/84    Pulse 77    Ht 6\' 1"  (1.854 m)    Wt 222 lb (100.7 kg)    SpO2 97%    BMI 29.29 kg/m     Wt Readings from Last 3 Encounters:  08/06/21 222 lb (100.7 kg)  02/11/15 211 lb 6.4 oz (95.9 kg)  07/26/11 201 lb (91.2 kg)     GEN: Tall, with slight increase in weight.. No acute distress HEENT: Normal NECK: No JVD. LYMPHATICS: No lymphadenopathy CARDIAC: No murmur. RRR no gallop, or edema. VASCULAR:  Normal Pulses. No bruits. RESPIRATORY:  Clear to auscultation without rales, wheezing or rhonchi  ABDOMEN: Soft, non-tender, non-distended, No pulsatile mass, MUSCULOSKELETAL: No deformity  SKIN: Warm and dry NEUROLOGIC:  Alert and oriented x 3 PSYCHIATRIC:  Normal affect   ASSESSMENT:    1. Palpitations   2. Dizziness   3. Family history of early CAD   4. Hyperlipidemia, unspecified hyperlipidemia type    PLAN:    In order of problems listed above:  He has 1 demonstrated episode of heart rate 140s that lasted a brief period of time but no other recurrence.  It was only recognized as increase in heart rate of such nature.  He has had his digital device for years and never before again in the long-arm.  We will simply observe.  He needs to continue to monitor with his device to see if he is having episodes of tachycardia.  Episode of rapid heart rate was possibly precipitated by prednisone therapy. Dizziness in the setting of ear stuffiness and tinnitus raises the question of vertigo.  It is improved after prednisone. Maternal grandfather and a maternal uncle had premature atherosclerosis. A lipid panel performed in 2020 demonstrated a total cholesterol of 142 HDL of 62 and LDL of 72.  I have recommended that he undergo a coronary calcium score to ensure proper risk categorization given the family history of CAD.  His lipids are nave of therapy.   Medication Adjustments/Labs and Tests Ordered: Current medicines are reviewed at length with the patient today.  Concerns  regarding medicines are outlined above.  Orders Placed This Encounter  Procedures   CT CARDIAC SCORING (SELF PAY ONLY)   No orders of the defined types were placed in this encounter.   Patient Instructions  Medication Instructions:  Your physician recommends that you continue on your current medications as directed. Please refer to the Current Medication list given to you today.  *If you need a refill on your cardiac medications before your next appointment, please call your pharmacy*   Lab Work: NONE If you have labs (blood work) drawn today and your tests are completely normal, you will receive your results  only by: [employer] (if you have MyChart) OR A paper copy in the mail If you have any lab test that is abnormal or we need to change your treatment, we will call you to review the results.   Testing/Procedures: Your physician has recommended you have a coronary calcium score.   Follow-Up: At [facility], you and your health needs are our priority.  As part of our continuing mission to provide you with exceptional heart care, we have created designated Provider Care Teams.  These Care Teams include your primary Cardiologist (physician) and Advanced Practice Providers (APPs -  Physician Assistants and Nurse Practitioners) who all work together to provide you with the care you need, when you need it.  We recommend signing up for the patient portal called "MyChart".  Sign up information is provided on this After Visit Summary.  MyChart is used to connect with patients for Virtual Visits (Telemedicine).  Patients are able to view lab/test results, encounter notes, upcoming appointments, etc.  Non-urgent messages can be sent to your provider as well.   To learn more about what you can do with MyChart, go to [email or web address].    Your next appointment:   As needed.  The format for your next appointment:   In Person  Provider:   Verdis Prime, MD     Signed, Lesleigh Noe, MD  08/06/2021 3:01 PM    [facility]

## 2021-08-06 ENCOUNTER — Encounter: Payer: Self-pay | Admitting: Interventional Cardiology

## 2021-08-06 ENCOUNTER — Other Ambulatory Visit: Payer: Self-pay

## 2021-08-06 ENCOUNTER — Ambulatory Visit: Payer: Medicare Other | Admitting: Interventional Cardiology

## 2021-08-06 VITALS — BP 122/84 | HR 77 | Ht 73.0 in | Wt 222.0 lb

## 2021-08-06 DIAGNOSIS — E785 Hyperlipidemia, unspecified: Secondary | ICD-10-CM

## 2021-08-06 DIAGNOSIS — Z8249 Family history of ischemic heart disease and other diseases of the circulatory system: Secondary | ICD-10-CM

## 2021-08-06 DIAGNOSIS — R002 Palpitations: Secondary | ICD-10-CM

## 2021-08-06 DIAGNOSIS — R42 Dizziness and giddiness: Secondary | ICD-10-CM

## 2021-08-06 NOTE — Patient Instructions (Signed)
Medication Instructions:  Your physician recommends that you continue on your current medications as directed. Please refer to the Current Medication list given to you today.  *If you need a refill on your cardiac medications before your next appointment, please call your pharmacy*   Lab Work: NONE If you have labs (blood work) drawn today and your tests are completely normal, you will receive your results only by: [place] (if you have MyChart) OR A paper copy in the mail If you have any lab test that is abnormal or we need to change your treatment, we will call you to review the results.   Testing/Procedures: Your physician has recommended you have a coronary calcium score.   Follow-Up: At [facility], you and your health needs are our priority.  As part of our continuing mission to provide you with exceptional heart care, we have created designated Provider Care Teams.  These Care Teams include your primary Cardiologist (physician) and Advanced Practice Providers (APPs -  Physician Assistants and Nurse Practitioners) who all work together to provide you with the care you need, when you need it.  We recommend signing up for the patient portal called "MyChart".  Sign up information is provided on this After Visit Summary.  MyChart is used to connect with patients for Virtual Visits (Telemedicine).  Patients are able to view lab/test results, encounter notes, upcoming appointments, etc.  Non-urgent messages can be sent to your provider as well.   To learn more about what you can do with MyChart, go to [email or web address].    Your next appointment:   As needed.  The format for your next appointment:   In Person  Provider:   Daneen Schick, MD

## 2021-08-08 DIAGNOSIS — D229 Melanocytic nevi, unspecified: Secondary | ICD-10-CM | POA: Diagnosis not present

## 2021-08-08 DIAGNOSIS — L821 Other seborrheic keratosis: Secondary | ICD-10-CM | POA: Diagnosis not present

## 2021-08-08 DIAGNOSIS — L814 Other melanin hyperpigmentation: Secondary | ICD-10-CM | POA: Diagnosis not present

## 2021-08-08 DIAGNOSIS — D225 Melanocytic nevi of trunk: Secondary | ICD-10-CM | POA: Diagnosis not present

## 2021-08-08 DIAGNOSIS — L570 Actinic keratosis: Secondary | ICD-10-CM | POA: Diagnosis not present

## 2021-08-18 DIAGNOSIS — J329 Chronic sinusitis, unspecified: Secondary | ICD-10-CM | POA: Diagnosis not present

## 2021-08-18 DIAGNOSIS — K219 Gastro-esophageal reflux disease without esophagitis: Secondary | ICD-10-CM | POA: Diagnosis not present

## 2021-08-18 DIAGNOSIS — H903 Sensorineural hearing loss, bilateral: Secondary | ICD-10-CM | POA: Diagnosis not present

## 2021-08-18 DIAGNOSIS — J3489 Other specified disorders of nose and nasal sinuses: Secondary | ICD-10-CM | POA: Diagnosis not present

## 2021-08-18 DIAGNOSIS — J342 Deviated nasal septum: Secondary | ICD-10-CM | POA: Diagnosis not present

## 2021-08-22 DIAGNOSIS — L570 Actinic keratosis: Secondary | ICD-10-CM | POA: Diagnosis not present

## 2021-08-22 DIAGNOSIS — L738 Other specified follicular disorders: Secondary | ICD-10-CM | POA: Diagnosis not present

## 2021-08-27 ENCOUNTER — Other Ambulatory Visit: Payer: Self-pay | Admitting: Otolaryngology

## 2021-08-27 DIAGNOSIS — H9312 Tinnitus, left ear: Secondary | ICD-10-CM

## 2021-09-03 DIAGNOSIS — Z1211 Encounter for screening for malignant neoplasm of colon: Secondary | ICD-10-CM | POA: Diagnosis not present

## 2021-09-16 ENCOUNTER — Ambulatory Visit
Admission: RE | Admit: 2021-09-16 | Discharge: 2021-09-16 | Disposition: A | Discharge status: Home / Self Care | Payer: Medicare Other | Source: Ambulatory Visit | Attending: Otolaryngology | Admitting: Otolaryngology

## 2021-09-16 ENCOUNTER — Other Ambulatory Visit: Payer: Self-pay

## 2021-09-16 DIAGNOSIS — H9312 Tinnitus, left ear: Secondary | ICD-10-CM

## 2021-09-16 MED ORDER — GADOBENATE DIMEGLUMINE 529 MG/ML IV SOLN
19.0000 mL | Freq: Once | INTRAVENOUS | Status: AC | PRN
  Administered 2021-09-16: 19 mL via INTRAVENOUS

## 2021-09-18 ENCOUNTER — Other Ambulatory Visit: Payer: Medicare Other

## 2021-09-23 ENCOUNTER — Ambulatory Visit
Admission: RE | Admit: 2021-09-23 | Discharge: 2021-09-23 | Disposition: A | Discharge status: Home / Self Care | Payer: Self-pay | Source: Ambulatory Visit | Attending: Interventional Cardiology | Admitting: Interventional Cardiology

## 2021-09-23 DIAGNOSIS — Z8249 Family history of ischemic heart disease and other diseases of the circulatory system: Secondary | ICD-10-CM

## 2021-09-23 DIAGNOSIS — R42 Dizziness and giddiness: Secondary | ICD-10-CM

## 2021-09-23 DIAGNOSIS — R002 Palpitations: Secondary | ICD-10-CM

## 2021-09-23 DIAGNOSIS — E785 Hyperlipidemia, unspecified: Secondary | ICD-10-CM

## 2021-10-30 ENCOUNTER — Encounter: Payer: Self-pay | Admitting: Neurology

## 2021-10-30 DIAGNOSIS — R519 Headache, unspecified: Secondary | ICD-10-CM | POA: Diagnosis not present

## 2021-10-30 DIAGNOSIS — H9312 Tinnitus, left ear: Secondary | ICD-10-CM | POA: Diagnosis not present

## 2021-10-30 DIAGNOSIS — H903 Sensorineural hearing loss, bilateral: Secondary | ICD-10-CM | POA: Diagnosis not present

## 2021-12-12 ENCOUNTER — Ambulatory Visit: Payer: Medicare Other | Admitting: Neurology

## 2021-12-12 ENCOUNTER — Encounter: Payer: Self-pay | Admitting: Neurology

## 2021-12-12 VITALS — BP 120/70 | HR 78 | Ht 68.0 in | Wt 220.0 lb

## 2021-12-12 DIAGNOSIS — H9312 Tinnitus, left ear: Secondary | ICD-10-CM | POA: Diagnosis not present

## 2021-12-12 NOTE — Progress Notes (Signed)
NEUROLOGY CONSULTATION NOTE  Mannan Debaker MRN: [id number] DOB: [birth year]  Referring provider: Serena Colonel, MD Primary care provider: Leodis Sias, MD  Reason for consult:  headache  Assessment/Plan:   Left-sided tinnitus - may have been triggered by ear infection but does not have any abnormalities on imaging to explain it.  Imaging did show trace left mastoid effusion but I am not convinced that this would cause the tinnitus in absence of other symptoms such as aural fullness and ear pain.  May want to consider one other round of antibiotics.  However, we discussed that we often never find a cause for tinnitus and no definite treatment.  Subjective:  Joseph Henderson is a 71 year old male who presents for headache.  History supplemented by referring provider's note.  In November, he had bilateral ear infections, worse on the left.  He was treated with antibiotics.  The infection cleared up but he started having non-pulsatile tinnitus in the left ear, described as white noise.  It fluctuates in pitch and volume.  No associated headache, ear pain, aural fullness.  He has been evaluated by ENT and found to have sensorineural hearing loss but too mild to warrant hearing aides.  CT sinuses on 08/18/2021 showed rightward nasal septal deviation with possible rhinitis but sinuses patent.  MRI of brain and IACs with and without contrast on 09/16/2021 personally reviewed showed small left mastoid effusion but otherwise unremarkable.       PAST MEDICAL HISTORY: No past medical history on file.  PAST SURGICAL HISTORY: Past Surgical History:  Procedure Laterality Date   COLONOSCOPY  2007   ESOPHAGOGASTRODUODENOSCOPY  06/09/2011   Procedure: ESOPHAGOGASTRODUODENOSCOPY (EGD);  Surgeon: Rob Bunting, MD;  Location: [facility];  Service: Endoscopy;  Laterality: N/A;    MEDICATIONS: Current Outpatient Medications on File Prior to Visit  Medication Sig Dispense Refill   Cetirizine HCl (ZYRTEC  ALLERGY) 10 MG CAPS Take 1 capsule by mouth daily.     clindamycin (CLEOCIN) 300 MG capsule Take 300 mg by mouth 3 (three) times daily.     fluticasone (FLONASE) 50 MCG/ACT nasal spray Place 2 sprays into both nostrils as needed.     Glucosamine 500 MG CAPS Take 1 capsule by mouth daily.     Multiple Vitamin (MULTIVITAMIN ADULT) TABS daily.     omeprazole (PRILOSEC) 20 MG capsule Take 20 mg by mouth as needed.       OVER THE COUNTER MEDICATION Flavonoid Pt takes for ringing in the ear.     No current facility-administered medications on file prior to visit.    ALLERGIES: Allergies  Allergen Reactions   Amoxicillin     FAMILY HISTORY: No family history on file.  Objective:  Blood pressure 120/70, pulse 78, height 5\' 8"  (1.727 m), weight 220 lb (99.8 kg), SpO2 98 %. General: No acute distress.  Patient appears well-groomed.   Head:  Normocephalic/atraumatic Eyes:  fundi examined but not visualized Neck: supple, no paraspinal tenderness, full range of motion Heart: regular rate and rhythm Neurological Exam: Mental status: alert and oriented to person, place, and time, recent and remote memory intact, fund of knowledge intact, attention and concentration intact, speech fluent and not dysarthric, language intact. Cranial nerves: CN I: not tested CN II: pupils equal, round and reactive to light, visual fields intact CN III, IV, VI:  full range of motion, no nystagmus, no ptosis CN V: facial sensation intact. CN VII: upper and lower face symmetric CN VIII: hearing slightly reduced in left  ear. CN IX, X: gag intact, uvula midline CN XI: sternocleidomastoid and trapezius muscles intact CN XII: tongue midline Bulk & Tone: normal, no fasciculations. Motor:  muscle strength 5/5 throughout Sensation:  Pinprick, temperature and vibratory sensation intact. Deep Tendon Reflexes:  2+ throughout,  toes downgoing.   Finger to nose testing:  Without dysmetria.   Heel to shin:  Without  dysmetria.   Gait:  Normal station and stride.  Romberg negative.    Thank you for allowing me to take part in the care of this patient.  Shon Millet, DO  CC:  Serena Colonel, MD  Leodis Sias, MD

## 2021-12-22 DIAGNOSIS — Z0000 Encounter for general adult medical examination without abnormal findings: Secondary | ICD-10-CM | POA: Diagnosis not present

## 2021-12-22 DIAGNOSIS — Z1331 Encounter for screening for depression: Secondary | ICD-10-CM | POA: Diagnosis not present

## 2021-12-22 DIAGNOSIS — H9312 Tinnitus, left ear: Secondary | ICD-10-CM | POA: Diagnosis not present

## 2022-01-26 DIAGNOSIS — R7989 Other specified abnormal findings of blood chemistry: Secondary | ICD-10-CM | POA: Diagnosis not present

## 2022-01-26 DIAGNOSIS — Z0000 Encounter for general adult medical examination without abnormal findings: Secondary | ICD-10-CM | POA: Diagnosis not present

## 2022-01-26 DIAGNOSIS — Z125 Encounter for screening for malignant neoplasm of prostate: Secondary | ICD-10-CM | POA: Diagnosis not present

## 2022-02-02 DIAGNOSIS — R82998 Other abnormal findings in urine: Secondary | ICD-10-CM | POA: Diagnosis not present

## 2022-02-09 ENCOUNTER — Ambulatory Visit: Payer: Medicare Other | Admitting: Neurology

## 2022-02-11 DIAGNOSIS — H5203 Hypermetropia, bilateral: Secondary | ICD-10-CM | POA: Diagnosis not present

## 2022-04-08 DIAGNOSIS — M25571 Pain in right ankle and joints of right foot: Secondary | ICD-10-CM | POA: Diagnosis not present

## 2022-04-08 DIAGNOSIS — S9001XA Contusion of right ankle, initial encounter: Secondary | ICD-10-CM | POA: Diagnosis not present

## 2022-06-04 DIAGNOSIS — R208 Other disturbances of skin sensation: Secondary | ICD-10-CM | POA: Diagnosis not present

## 2022-06-04 DIAGNOSIS — L538 Other specified erythematous conditions: Secondary | ICD-10-CM | POA: Diagnosis not present

## 2022-06-04 DIAGNOSIS — L820 Inflamed seborrheic keratosis: Secondary | ICD-10-CM | POA: Diagnosis not present

## 2022-06-04 DIAGNOSIS — D485 Neoplasm of uncertain behavior of skin: Secondary | ICD-10-CM | POA: Diagnosis not present

## 2022-07-06 DIAGNOSIS — R69 Illness, unspecified: Secondary | ICD-10-CM | POA: Diagnosis not present

## 2022-11-10 DIAGNOSIS — L237 Allergic contact dermatitis due to plants, except food: Secondary | ICD-10-CM | POA: Diagnosis not present

## 2022-11-10 DIAGNOSIS — L282 Other prurigo: Secondary | ICD-10-CM | POA: Diagnosis not present

## 2022-11-12 DIAGNOSIS — L820 Inflamed seborrheic keratosis: Secondary | ICD-10-CM | POA: Diagnosis not present

## 2022-11-12 DIAGNOSIS — D485 Neoplasm of uncertain behavior of skin: Secondary | ICD-10-CM | POA: Diagnosis not present

## 2022-11-12 DIAGNOSIS — L538 Other specified erythematous conditions: Secondary | ICD-10-CM | POA: Diagnosis not present

## 2022-11-12 DIAGNOSIS — Z789 Other specified health status: Secondary | ICD-10-CM | POA: Diagnosis not present

## 2022-11-12 DIAGNOSIS — C44719 Basal cell carcinoma of skin of left lower limb, including hip: Secondary | ICD-10-CM | POA: Diagnosis not present

## 2022-11-12 DIAGNOSIS — L298 Other pruritus: Secondary | ICD-10-CM | POA: Diagnosis not present

## 2022-11-20 DIAGNOSIS — R69 Illness, unspecified: Secondary | ICD-10-CM | POA: Diagnosis not present

## 2022-12-08 DIAGNOSIS — Z880 Allergy status to penicillin: Secondary | ICD-10-CM | POA: Diagnosis not present

## 2022-12-08 DIAGNOSIS — Z85828 Personal history of other malignant neoplasm of skin: Secondary | ICD-10-CM | POA: Diagnosis not present

## 2022-12-08 DIAGNOSIS — Z008 Encounter for other general examination: Secondary | ICD-10-CM | POA: Diagnosis not present

## 2022-12-08 DIAGNOSIS — M1990 Unspecified osteoarthritis, unspecified site: Secondary | ICD-10-CM | POA: Diagnosis not present

## 2022-12-08 DIAGNOSIS — I499 Cardiac arrhythmia, unspecified: Secondary | ICD-10-CM | POA: Diagnosis not present

## 2022-12-08 DIAGNOSIS — K219 Gastro-esophageal reflux disease without esophagitis: Secondary | ICD-10-CM | POA: Diagnosis not present

## 2022-12-08 DIAGNOSIS — Z8249 Family history of ischemic heart disease and other diseases of the circulatory system: Secondary | ICD-10-CM | POA: Diagnosis not present

## 2022-12-08 DIAGNOSIS — I739 Peripheral vascular disease, unspecified: Secondary | ICD-10-CM | POA: Diagnosis not present

## 2022-12-09 ENCOUNTER — Telehealth: Payer: Self-pay | Admitting: Interventional Cardiology

## 2022-12-09 NOTE — Telephone Encounter (Signed)
Patient was Dr. Eldridge [name] to be his dr now. Please advise

## 2022-12-09 NOTE — Telephone Encounter (Signed)
Called the pt back.  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    He is needing to establish with a new Cardiologist and would like for Dr. Eldridge [name] to take over his care, since Dr. [name] retired.   Pt states he recently went in for a wellness visit/vascular screening, and was told that he has an arrhythmia.  Pt was advised to get in with his Cardiologist for further evaluation of this.   Pt is ok see an APP first then see Dr. Eldridge [name] at the next follow-up, for new arrhythmia finding.  Scheduled the pt to see Jari Favre PA-C for next Tuesday 6/25 at 2:45 pm, for further evaluation of new arrhythmia noted. He is aware to arrive 15 mins prior to this appt.  Informed the pt at the appt with [name], her team can arrange his next follow-up appt with Dr. Eldridge [name] at check out.  Noted this in appt notes.   Informed the pt that I will send this message to Dr. Eldridge [name] and RN as a general FYI.   Pt verbalized understanding and agrees with this plan.

## 2022-12-15 ENCOUNTER — Ambulatory Visit: Payer: Medicare HMO | Attending: Physician Assistant | Admitting: Physician Assistant

## 2022-12-15 ENCOUNTER — Encounter: Payer: Self-pay | Admitting: Physician Assistant

## 2022-12-15 VITALS — BP 124/74 | HR 115 | Ht 72.0 in | Wt 215.0 lb

## 2022-12-15 DIAGNOSIS — I4891 Unspecified atrial fibrillation: Secondary | ICD-10-CM | POA: Diagnosis not present

## 2022-12-15 DIAGNOSIS — Z8249 Family history of ischemic heart disease and other diseases of the circulatory system: Secondary | ICD-10-CM | POA: Diagnosis not present

## 2022-12-15 DIAGNOSIS — R002 Palpitations: Secondary | ICD-10-CM

## 2022-12-15 DIAGNOSIS — E785 Hyperlipidemia, unspecified: Secondary | ICD-10-CM

## 2022-12-15 DIAGNOSIS — R42 Dizziness and giddiness: Secondary | ICD-10-CM | POA: Diagnosis not present

## 2022-12-15 MED ORDER — APIXABAN 5 MG PO TABS: 5.0000 mg | ORAL_TABLET | Freq: Two times a day (BID) | ORAL | 2 refills | Status: DC

## 2022-12-15 MED ORDER — METOPROLOL TARTRATE 25 MG PO TABS: 12.5000 mg | ORAL_TABLET | Freq: Two times a day (BID) | ORAL | 3 refills | Status: DC

## 2022-12-15 MED ORDER — APIXABAN 5 MG PO TABS: 5.0000 mg | ORAL_TABLET | Freq: Two times a day (BID) | ORAL | 0 refills | Status: DC

## 2022-12-15 NOTE — Patient Instructions (Addendum)
Medication Instructions:   START TAKING: ELIQUIS 5 MG ONCE   START TAKING:  METOPROLOL 12.5 MG TWICE  A DAY   *If you need a refill on your cardiac medications before your next appointment, please call your pharmacy*   Lab Work:  BMET MAG CBC AND TSH TODAY   If you have labs (blood work) drawn today and your tests are completely normal, you will receive your results only by: MyChart Message (if you have MyChart) OR A paper copy in the mail If you have any lab test that is abnormal or we need to change your treatment, we will call you to review the results.   Testing/Procedures: Your physician has requested that you have an echocardiogram. Echocardiography is a painless test that uses sound waves to create images of your heart. It provides your doctor with information about the size and shape of your heart and how well your heart's chambers and valves are working. This procedure takes approximately one hour. There are no restrictions for this procedure. Please do NOT wear cologne, perfume, aftershave, or lotions (deodorant is allowed). Please arrive 15 minutes prior to your appointment time.      Follow-Up: At [facility], you and your health needs are our priority.  As part of our continuing mission to provide you with exceptional heart care, we have created designated Provider Care Teams.  These Care Teams include your primary Cardiologist (physician) and Advanced Practice Providers (APPs -  Physician Assistants and Nurse Practitioners) who all work together to provide you with the care you need, when you need it.  We recommend signing up for the patient portal called "MyChart".  Sign up information is provided on this After Visit Summary.  MyChart is used to connect with patients for Virtual Visits (Telemedicine).  Patients are able to view lab/test results, encounter notes, upcoming appointments, etc.  Non-urgent messages can be sent to your provider as well.   To learn more  about what you can do with MyChart, go to [email or web address].    Your next appointment:    3 week(s)  Provider:  Jari Favre PA-C    Other Instructions  Low-Sodium Eating Plan Salt (sodium) helps you keep a healthy balance of fluids in your body. Too much sodium can raise your blood pressure. It can also cause fluid and waste to be held in your body. Your health care provider or dietitian may recommend a low-sodium eating plan if you have high blood pressure (hypertension), kidney disease, liver disease, or heart failure. Eating less sodium can help lower your blood pressure and reduce swelling. It can also protect your heart, liver, and kidneys. What are tips for following this plan? Reading food labels  Check food labels for the amount of sodium per serving. If you eat more than one serving, you must multiply the listed amount by the number of servings. Choose foods with less than 140 milligrams (mg) of sodium per serving. Avoid foods with 300 mg of sodium or more per serving. Always check how much sodium is in a product, even if the label says "unsalted" or "no salt added." Shopping  Buy products labeled as "low-sodium" or "no salt added." Buy fresh foods. Avoid canned foods and pre-made or frozen meals. Avoid canned, cured, or processed meats. Buy breads that have less than 80 mg of sodium per slice. Cooking  Eat more home-cooked food. Try to eat less restaurant, buffet, and fast food. Try not to add salt when you cook. Use salt-free  seasonings or herbs instead of table salt or sea salt. Check with your provider or pharmacist before using salt substitutes. Cook with plant-based oils, such as canola, sunflower, or olive oil. Meal planning When eating at a restaurant, ask if your food can be made with less salt or no salt. Avoid dishes labeled as brined, pickled, cured, or smoked. Avoid dishes made with soy sauce, miso, or teriyaki sauce. Avoid foods that have monosodium  glutamate (MSG) in them. MSG may be added to some restaurant food, sauces, soups, bouillon, and canned foods. Make meals that can be grilled, baked, poached, roasted, or steamed. These are often made with less sodium. General information Try to limit your sodium intake to 1,500-2,300 mg each day, or the amount told by your provider. What foods should I eat? Fruits Fresh, frozen, or canned fruit. Fruit juice. Vegetables Fresh or frozen vegetables. "No salt added" canned vegetables. "No salt added" tomato sauce and paste. Low-sodium or reduced-sodium tomato and vegetable juice. Grains Low-sodium cereals, such as oats, puffed wheat and rice, and shredded wheat. Low-sodium crackers. Unsalted rice. Unsalted pasta. Low-sodium bread. Whole grain breads and whole grain pasta. Meats and other proteins Fresh or frozen meat, poultry, seafood, and fish. These should have no added salt. Low-sodium canned tuna and salmon. Unsalted nuts. Dried peas, beans, and lentils without added salt. Unsalted canned beans. Eggs. Unsalted nut butters. Dairy Milk. Soy milk. Cheese that is naturally low in sodium, such as ricotta cheese, fresh mozzarella, or Swiss cheese. Low-sodium or reduced-sodium cheese. Cream cheese. Yogurt. Seasonings and condiments Fresh and dried herbs and spices. Salt-free seasonings. Low-sodium mustard and ketchup. Sodium-free salad dressing. Sodium-free light mayonnaise. Fresh or refrigerated horseradish. Lemon juice. Vinegar. Other foods Homemade, reduced-sodium, or low-sodium soups. Unsalted popcorn and pretzels. Low-salt or salt-free chips. The items listed above may not be all the foods and drinks you can have. Talk to a dietitian to learn more. What foods should I avoid? Vegetables Sauerkraut, pickled vegetables, and relishes. Olives. [place] fries. Onion rings. Regular canned vegetables, except low-sodium or reduced-sodium items. Regular canned tomato sauce and paste. Regular tomato and  vegetable juice. Frozen vegetables in sauces. Grains Instant hot cereals. Bread stuffing, pancake, and biscuit mixes. Croutons. Seasoned rice or pasta mixes. Noodle soup cups. Boxed or frozen macaroni and cheese. Regular salted crackers. Self-rising flour. Meats and other proteins Meat or fish that is salted, canned, smoked, spiced, or pickled. Precooked or cured meat, such as sausages or meat loaves. Tomasa Blase. Ham. Pepperoni. Hot dogs. Corned beef. Chipped beef. Salt pork. Jerky. Pickled herring, anchovies, and sardines. Regular canned tuna. Salted nuts. Dairy Processed cheese and cheese spreads. Hard cheeses. Cheese curds. Blue cheese. Feta cheese. String cheese. Regular cottage cheese. Buttermilk. Canned milk. Fats and oils Salted butter. Regular margarine. Ghee. Bacon fat. Seasonings and condiments Onion salt, garlic salt, seasoned salt, table salt, and sea salt. Canned and packaged gravies. Worcestershire sauce. Tartar sauce. Barbecue sauce. Teriyaki sauce. Soy sauce, including reduced-sodium soy sauce. Steak sauce. Fish sauce. Oyster sauce. Cocktail sauce. Horseradish that you find on the shelf. Regular ketchup and mustard. Meat flavorings and tenderizers. Bouillon cubes. Hot sauce. Pre-made or packaged marinades. Pre-made or packaged taco seasonings. Relishes. Regular salad dressings. Salsa. Other foods Salted popcorn and pretzels. Corn chips and puffs. Potato and tortilla chips. Canned or dried soups. Pizza. Frozen entrees and pot pies. The items listed above may not be all the foods and drinks you should avoid. Talk to a dietitian to learn more. This information is  not intended to replace advice given to you by your health care provider. Make sure you discuss any questions you have with your health care provider. Document Revised: 06/25/2022 Document Reviewed: 06/25/2022 Elsevier Patient Education  2024 [employer].

## 2022-12-15 NOTE — Progress Notes (Signed)
Cardiology Office Note:  .   Date:  12/15/2022  ID:  Joseph Henderson, DOB [birth year], MRN [id number] PCP: Geoffry Paradise, MD  [facility] Providers Cardiologist:  None {   History of Present Illness: .   Joseph Henderson is a 72 y.o. male with a past medical history of dizziness and tachycardia last seen February 2023 and presents for follow-up appointment.  At that time, he was doing okay.  He was followed by Dr. Katrinka Blazing previously.  Several people had recommended that he see a cardiologist.  He became concerned about his heart after a trip to [place] where he developed nasal congestion, tinnitus, dizziness.  At that time, seen by ENT and placed on prednisone.  After being on prednisone for period of time his wearable digital device was registering heart rates greater than 140 for less than 5 minutes on 1 occasion.  He was asymptomatic when this occurred.  Because of increased heart rate he has requested cardiology evaluation.  He has never smoked.  Physically active and does not experience exertional discomfort.  Denies angina.  Walks 60-minute miles without limitations.  Did 3 miles the day prior.  Never had a syncopal episode.  Family history includes maternal grandfather had an MI as did his great uncle.  Not a diabetic.  No history of hypertension or hyperlipidemia.  He does drink alcohol.   Today, he presents for follow-up appointment.  He had a home health nurse listen to his heart and said that he needed to follow-up ASAP with cardiology.  Call our office on 6/19 to arrange follow-up.  Today, EKG shows atrial fibrillation, rate 115 bpm.  Patient is asymptomatic at this time.  He has friends that have atrial fibrillation so he is somewhat familiar with this diagnosis.  We discussed a plan of action including starting Eliquis and metoprolol as well as ordering an echocardiogram for further workup.  He tells me sometimes he suffers with lower extremity cramping and he takes blackstrap  molasses which has 600 mg of potassium (regular banana has 80 mg potassium).  This was an old remedy that his grandmother taught him.  Taste terrible but it says it works almost immediately eliminate his cramps.  Reports no shortness of breath nor dyspnea on exertion. Reports no chest pain, pressure, or tightness. No edema, orthopnea, PND. Reports no palpitations.    ROS: Pertinent ROS located in the HPI  Studies Reviewed: .       Cardiac CT with calcium score 09/23/2021 IMPRESSION: Coronary calcium score of 0. This was 0 percentile for age-, race-, and sex-matched controls. Normal to borderline enlarged caliber of ascending aorta. Significant aortic atherosclerosis noted in aortic Arch  EKG: Atrial fibrillation with RVR, rate 115 bpm Risk Assessment/Calculations:    CHA2DS2-VASc Score = 1   This indicates a 0.6% annual risk of stroke. The patient's score is based upon: CHF History: 0 HTN History: 0 Diabetes History: 0 Stroke History: 0 Vascular Disease History: 0 Age Score: 1 Gender Score: 0             Physical Exam:   VS:  BP 124/74   Pulse (!) 115   Ht 6' (1.829 m)   Wt 215 lb (97.5 kg)   SpO2 98%   BMI 29.16 kg/m    Wt Readings from Last 3 Encounters:  12/15/22 215 lb (97.5 kg)  12/12/21 220 lb (99.8 kg)  08/06/21 222 lb (100.7 kg)    GEN: Well nourished, well developed in no acute  distress NECK: No JVD; No carotid bruits CARDIAC: Irregularly irregular, no murmurs, rubs, gallops RESPIRATORY:  Clear to auscultation without rales, wheezing or rhonchi  ABDOMEN: Soft, non-tender, non-distended EXTREMITIES:  No edema; No deformity   ASSESSMENT AND PLAN: .   1.  New Afib diagnosis -We discussed starting him on Eliquis 5 mg twice a day as well as metoprolol 12.5 mg twice a day -He has not really currently taking many medicines at all from a cardiac standpoint -Luckily, his CHA2DS2-VASc score is less than 1% chance for stroke -Will obtain an echocardiogram and  bring him back in a few weeks for evaluation and possible DCCV -Endorse very occasional palpitations -Lab work today includes BMP, CBC, mag, TSH.    3.  Family history of early CAD/HLD -Recent LDL 106 (01/2022) -Will be due for repeat lipid panel and LFTs next time he is in the office or with his PCP -Would aim for an LDL less than 70 due to strong family history of CAD        Dispo: Follow-up in 3 weeks with me to discuss possible DCCV  Signed, Sharlene Dory, PA-C

## 2022-12-16 LAB — CBC
Hematocrit: 45.4 % (ref 37.5–51.0)
Hemoglobin: 15.5 g/dL (ref 13.0–17.7)
MCH: 32.9 pg (ref 26.6–33.0)
MCHC: 34.1 g/dL (ref 31.5–35.7)
MCV: 96 fL (ref 79–97)
Platelets: 197 10*3/uL (ref 150–450)
RBC: 4.71 x10E6/uL (ref 4.14–5.80)
RDW: 13.4 % (ref 11.6–15.4)
WBC: 6.5 10*3/uL (ref 3.4–10.8)

## 2022-12-16 LAB — BASIC METABOLIC PANEL
BUN/Creatinine Ratio: 16 (ref 10–24)
BUN: 20 mg/dL (ref 8–27)
CO2: 24 mmol/L (ref 20–29)
Calcium: 9.6 mg/dL (ref 8.6–10.2)
Chloride: 101 mmol/L (ref 96–106)
Creatinine, Ser: 1.22 mg/dL (ref 0.76–1.27)
Glucose: 97 mg/dL (ref 70–99)
Potassium: 4.7 mmol/L (ref 3.5–5.2)
Sodium: 139 mmol/L (ref 134–144)
eGFR: 63 mL/min/{1.73_m2} (ref >59)

## 2022-12-16 LAB — MAGNESIUM: Magnesium: 2.1 mg/dL (ref 1.6–2.3)

## 2022-12-16 LAB — TSH: TSH: 1.17 u[IU]/mL (ref 0.450–4.500)

## 2022-12-20 DIAGNOSIS — I739 Peripheral vascular disease, unspecified: Secondary | ICD-10-CM

## 2022-12-22 DIAGNOSIS — C44719 Basal cell carcinoma of skin of left lower limb, including hip: Secondary | ICD-10-CM | POA: Diagnosis not present

## 2022-12-23 ENCOUNTER — Ambulatory Visit: Payer: Medicare Other | Admitting: Nurse Practitioner

## 2022-12-28 DIAGNOSIS — K219 Gastro-esophageal reflux disease without esophagitis: Secondary | ICD-10-CM | POA: Diagnosis not present

## 2022-12-28 DIAGNOSIS — Z1211 Encounter for screening for malignant neoplasm of colon: Secondary | ICD-10-CM | POA: Diagnosis not present

## 2023-01-11 ENCOUNTER — Ambulatory Visit (HOSPITAL_COMMUNITY): Payer: Medicare HMO | Attending: Internal Medicine

## 2023-01-11 DIAGNOSIS — I4891 Unspecified atrial fibrillation: Secondary | ICD-10-CM | POA: Diagnosis not present

## 2023-01-11 LAB — ECHOCARDIOGRAM COMPLETE: S' Lateral: 3.8 cm

## 2023-01-11 MED ORDER — PERFLUTREN LIPID MICROSPHERE
1.0000 mL | INTRAVENOUS | Status: AC | PRN
Start: 2023-01-11 — End: 2023-01-11
  Administered 2023-01-11: 2 mL via INTRAVENOUS

## 2023-01-13 ENCOUNTER — Telehealth: Payer: Self-pay

## 2023-01-13 DIAGNOSIS — R9430 Abnormal result of cardiovascular function study, unspecified: Secondary | ICD-10-CM

## 2023-01-13 NOTE — Telephone Encounter (Signed)
-----   Message from Sharlene Dory sent at 01/12/2023  5:31 PM EDT ----- Joseph Henderson,   We are going to refer you to the advanced heart failure clinic to further optimize medications.  We will get you a follow-up echocardiogram in a few months to recheck your heart pump function.  I did review your echo with Dr. Allyson Sabal.  If you have questions please let me know.  Thanks! Sharlene Dory, PA-C

## 2023-01-13 NOTE — Telephone Encounter (Signed)
Ordered echo.

## 2023-01-13 NOTE — Telephone Encounter (Signed)
-----   Message from Sharlene Dory sent at 01/12/2023  5:29 PM EDT ----- Can we please place a referral for AHF clinic? Echo in 3 months.   Thanks!  Sharlene Dory, PA-C ----- Message ----- From: Runell Gess, MD Sent: 01/12/2023   5:17 PM EDT To: Sharlene Dory, PA-C  We don't have a prior echo so we don't know the chronicity of the low EF. EKG done 12/22 shows NSR. Don't know how long he's been in AFIB either. With severe Bi atrial enlargement unlikely to successfully cardiovert. I would send him to the Advanced CHF clinic and get him medically optimized with GDMT and then re check 2D in 3 months. It doesn't sound like he's symptomatic though.  JJB ----- Message ----- From: Bunnie Domino Sent: 01/12/2023   9:41 AM EDT To: Runell Gess, MD  Dr. Allyson Sabal,  Do you think he would be a good DCCV candidate due to his dilated right and left atrium?  I think we need to bring him back ASAP to review options given his low EF.   Thanks! Sharlene Dory, PA-C ----- Message ----- From: Interface, [employer] Sent: 01/11/2023  11:09 AM EDT To: Sharlene Dory, PA-C

## 2023-01-13 NOTE — Telephone Encounter (Signed)
The patient has been notified of the result and verbalized understanding.  All questions (if any) were answered. Joseph [place], RN 01/13/2023 6:01 PM    Patient would like to talk to Jari Favre PA about his results.

## 2023-01-20 ENCOUNTER — Telehealth: Payer: Self-pay | Admitting: Physician Assistant

## 2023-01-20 DIAGNOSIS — I4891 Unspecified atrial fibrillation: Secondary | ICD-10-CM

## 2023-01-20 NOTE — Telephone Encounter (Signed)
Pt called in asking to speak with Ivy, LPN about a referral to CHF

## 2023-01-20 NOTE — Telephone Encounter (Signed)
Pt made aware referral to [facility] made last week.  Advised him to follow up with their office about appt if he has not heard from them by next week. Pt agreeable to this plan.  He also inquires about a call he was expecting from Mehama Conte,PA from last week.  States she never called him.  Aware I would forward to her to follow up with him. He appreciates the help.

## 2023-01-20 NOTE — Telephone Encounter (Signed)
When I spoke to pt earlier I advised him that referral was placed last week and they should call.  But gave him clinic number and told him he could call next week if he hasn't heard from them. Patient verbalized understanding and agreeable to plan.   (Will forward note to [facility] nurse)

## 2023-01-25 NOTE — Addendum Note (Signed)
Addended by: Lendon Ka on: 01/25/2023 10:58 AM   Modules accepted: Orders

## 2023-01-25 NOTE — Telephone Encounter (Signed)
Referral to afib clinic placed.

## 2023-01-26 DIAGNOSIS — L821 Other seborrheic keratosis: Secondary | ICD-10-CM | POA: Diagnosis not present

## 2023-01-26 DIAGNOSIS — C44719 Basal cell carcinoma of skin of left lower limb, including hip: Secondary | ICD-10-CM | POA: Diagnosis not present

## 2023-02-09 ENCOUNTER — Encounter (HOSPITAL_COMMUNITY): Payer: Self-pay | Admitting: Internal Medicine

## 2023-02-09 ENCOUNTER — Ambulatory Visit (HOSPITAL_COMMUNITY)
Admission: RE | Admit: 2023-02-09 | Discharge: 2023-02-09 | Disposition: A | Discharge status: Home / Self Care | Payer: Medicare HMO | Source: Ambulatory Visit | Attending: Internal Medicine | Admitting: Internal Medicine

## 2023-02-09 VITALS — BP 114/80 | HR 117 | Ht 72.0 in | Wt 214.0 lb

## 2023-02-09 DIAGNOSIS — Z7901 Long term (current) use of anticoagulants: Secondary | ICD-10-CM | POA: Diagnosis not present

## 2023-02-09 DIAGNOSIS — I4819 Other persistent atrial fibrillation: Secondary | ICD-10-CM

## 2023-02-09 DIAGNOSIS — I4891 Unspecified atrial fibrillation: Secondary | ICD-10-CM

## 2023-02-09 LAB — CBC
HCT: 46.2 % (ref 39.0–52.0)
Hemoglobin: 15.5 g/dL (ref 13.0–17.0)
MCH: 32.5 pg (ref 26.0–34.0)
MCHC: 33.5 g/dL (ref 30.0–36.0)
MCV: 96.9 fL (ref 80.0–100.0)
Platelets: 201 10*3/uL (ref 150–400)
RBC: 4.77 MIL/uL (ref 4.22–5.81)
RDW: 13.6 % (ref 11.5–15.5)
WBC: 6.3 10*3/uL (ref 4.0–10.5)
nRBC: 0 % (ref 0.0–0.2)

## 2023-02-09 LAB — BASIC METABOLIC PANEL
Anion gap: 10 (ref 5–15)
BUN: 18 mg/dL (ref 8–23)
CO2: 24 mmol/L (ref 22–32)
Calcium: 9.4 mg/dL (ref 8.9–10.3)
Chloride: 104 mmol/L (ref 98–111)
Creatinine, Ser: 1.14 mg/dL (ref 0.61–1.24)
GFR, Estimated: >60 mL/min (ref >60)
Glucose, Bld: 99 mg/dL (ref 70–99)
Potassium: 4.8 mmol/L (ref 3.5–5.1)
Sodium: 138 mmol/L (ref 135–145)

## 2023-02-09 NOTE — Progress Notes (Signed)
Primary Care Physician: Geoffry Paradise, MD Primary Cardiologist: None Electrophysiologist: None     Referring Physician: Jari Favre, PA-C     Joseph Henderson is a 72 y.o. male with a new history of paroxysmal atrial fibrillation who presents for consultation in the [facility]. History of coronary calcium score of 0 in 09/2021. Seen by Cardiology on 12/15/22 due to home health RN appreciating an abnormal heart rhythm per report. He was noted to be in Afib with RVR. Started on metoprolol 12.5 mg BID. Patient is on Eliquis 5 mg BID for a CHADS2VASC score of 1.  On evaluation today, he is currently in Afib with RVR. No missed doses of Eliquis. He has an Scientist, physiological. He does not drink excessive caffeine or alcohol. He doesn't believe he snores but not completely sure.  Today, he denies symptoms of palpitations, chest pain, shortness of breath, orthopnea, PND, lower extremity edema, dizziness, presyncope, syncope, snoring, daytime somnolence, bleeding, or neurologic sequela. The patient is tolerating medications without difficulties and is otherwise without complaint today.    Atrial Fibrillation Risk Factors:  he is unsure if he has symptoms or diagnosis of sleep apnea.   he has a BMI of Body mass index is 29.02 kg/m.Marland Kitchen Filed Weights   02/09/23 0857  Weight: 97.1 kg    Current Outpatient Medications  Medication Sig Dispense Refill   apixaban (ELIQUIS) 5 MG TABS tablet Take 1 tablet (5 mg total) by mouth 2 (two) times daily. 60 tablet 0   apixaban (ELIQUIS) 5 MG TABS tablet Take 1 tablet (5 mg total) by mouth 2 (two) times daily. 180 tablet 2   Cetirizine HCl (ZYRTEC ALLERGY) 10 MG CAPS Take 1 capsule by mouth daily as needed.     Glucosamine 500 MG CAPS Take 1 capsule by mouth daily.     metoprolol tartrate (LOPRESSOR) 25 MG tablet Take 0.5 tablets (12.5 mg total) by mouth 2 (two) times daily. 90 tablet 3   Multiple Vitamin (MULTIVITAMIN ADULT) TABS daily.      omeprazole (PRILOSEC) 20 MG capsule Take 20 mg by mouth as needed.       No current facility-administered medications for this encounter.    Atrial Fibrillation Management history:  Previous antiarrhythmic drugs: None Previous cardioversions: None Previous ablations: None Anticoagulation history: Eliquis   ROS- All systems are reviewed and negative except as per the HPI above.  Physical Exam: BP 114/80   Pulse (!) 117   Ht 6' (1.829 m)   Wt 97.1 kg   BMI 29.02 kg/m   GEN: Well nourished, well developed in no acute distress NECK: No JVD; No carotid bruits CARDIAC: Irregularly irregular tachycardic rate and rhythm, no murmurs, rubs, gallops RESPIRATORY:  Clear to auscultation without rales, wheezing or rhonchi  ABDOMEN: Soft, non-tender, non-distended EXTREMITIES:  No edema; No deformity   EKG today demonstrates  Vent. rate 117 BPM PR interval * ms QRS duration 98 ms QT/QTcB 336/468 ms P-R-T axes * 35 129 Atrial fibrillation with rapid ventricular response Nonspecific ST and T wave abnormality Abnormal ECG No previous ECGs available  Echo 01/11/23 demonstrated   1. Left ventricular ejection fraction, by estimation, is 30 to 35%. The  left ventricle has moderately decreased function. The left ventricle  demonstrates global hypokinesis. Left ventricular diastolic parameters are  indeterminate.   2. Right ventricular systolic function is normal. The right ventricular  size is severely enlarged. There is mildly elevated pulmonary artery  systolic pressure. The estimated  right ventricular systolic pressure is  36.9 mmHg.   3. Left atrial size was severely dilated.   4. Right atrial size was severely dilated.   5. The mitral valve is grossly normal. Mild to moderate mitral valve  regurgitation. No evidence of mitral stenosis.   6. Tricuspid valve regurgitation is mild to moderate.   7. The aortic valve is tricuspid. Aortic valve regurgitation is mild. No  aortic  stenosis is present.   8. The inferior vena cava is dilated in size with >50% respiratory  variability, suggesting right atrial pressure of 8 mmHg.    ASSESSMENT & PLAN CHA2DS2-VASc Score = 1  The patient's score is based upon: CHF History: 0 HTN History: 0 Diabetes History: 0 Stroke History: 0 Vascular Disease History: 0 Age Score: 1 Gender Score: 0       ASSESSMENT AND PLAN: Persistent Atrial Fibrillation (ICD10:  I48.19) The patient's CHA2DS2-VASc score is 1, indicating a 0.6% annual risk of stroke.    He is in Afib with RVR.  We discussed options of treatment going forward including cardioversion, AAD therapy, and ablation. After discussion, will proceed with scheduling DCCV. If he has ERAF, will consider AAD therapy and discuss with EP if ablation is feasible given biatrial enlargement. No missed doses of Eliquis.  Informed Consent   Shared Decision Making/Informed Consent The risks (stroke, cardiac arrhythmias rarely resulting in the need for a temporary or permanent pacemaker, skin irritation or burns and complications associated with conscious sedation including aspiration, arrhythmia, respiratory failure and death), benefits (restoration of normal sinus rhythm) and alternatives of a direct current cardioversion were explained in detail to [name] and he agrees to proceed.         Follow up 2 weeks after DCCV.    Lake Bells, PA-C  Afib Clinic [facility] [address] [place], [place] [address] [phone]

## 2023-02-09 NOTE — Patient Instructions (Signed)
Cardioversion scheduled for: Wednesday, August 21st    - Arrive at the [facility] and go to admitting at 1230pm   - Do not eat or drink anything after midnight the night prior to your procedure.   - Take all your morning medication (except diabetic medications) with a sip of water prior to arrival.  - You will not be able to drive home after your procedure.    - Do NOT miss any doses of your blood thinner - if you should miss a dose please notify our office immediately.   - If you feel as if you go back into normal rhythm prior to scheduled cardioversion, please notify our office immediately.   If your procedure is canceled in the cardioversion suite you will be charged a cancellation fee.

## 2023-02-09 NOTE — H&P (View-Only) (Signed)
Primary Care Physician: Geoffry Paradise, MD Primary Cardiologist: None Electrophysiologist: None     Referring Physician: Jari Favre, PA-C     Joseph Henderson is a 72 y.o. male with a new history of paroxysmal atrial fibrillation who presents for consultation in the [facility]. History of coronary calcium score of 0 in 09/2021. Seen by Cardiology on 12/15/22 due to home health RN appreciating an abnormal heart rhythm per report. He was noted to be in Afib with RVR. Started on metoprolol 12.5 mg BID. Patient is on Eliquis 5 mg BID for a CHADS2VASC score of 1.  On evaluation today, he is currently in Afib with RVR. No missed doses of Eliquis. He has an Scientist, physiological. He does not drink excessive caffeine or alcohol. He doesn't believe he snores but not completely sure.  Today, he denies symptoms of palpitations, chest pain, shortness of breath, orthopnea, PND, lower extremity edema, dizziness, presyncope, syncope, snoring, daytime somnolence, bleeding, or neurologic sequela. The patient is tolerating medications without difficulties and is otherwise without complaint today.    Atrial Fibrillation Risk Factors:  he is unsure if he has symptoms or diagnosis of sleep apnea.   he has a BMI of Body mass index is 29.02 kg/m.Marland Kitchen Filed Weights   02/09/23 0857  Weight: 97.1 kg    Current Outpatient Medications  Medication Sig Dispense Refill   apixaban (ELIQUIS) 5 MG TABS tablet Take 1 tablet (5 mg total) by mouth 2 (two) times daily. 60 tablet 0   apixaban (ELIQUIS) 5 MG TABS tablet Take 1 tablet (5 mg total) by mouth 2 (two) times daily. 180 tablet 2   Cetirizine HCl (ZYRTEC ALLERGY) 10 MG CAPS Take 1 capsule by mouth daily as needed.     Glucosamine 500 MG CAPS Take 1 capsule by mouth daily.     metoprolol tartrate (LOPRESSOR) 25 MG tablet Take 0.5 tablets (12.5 mg total) by mouth 2 (two) times daily. 90 tablet 3   Multiple Vitamin (MULTIVITAMIN ADULT) TABS daily.      omeprazole (PRILOSEC) 20 MG capsule Take 20 mg by mouth as needed.       No current facility-administered medications for this encounter.    Atrial Fibrillation Management history:  Previous antiarrhythmic drugs: None Previous cardioversions: None Previous ablations: None Anticoagulation history: Eliquis   ROS- All systems are reviewed and negative except as per the HPI above.  Physical Exam: BP 114/80   Pulse (!) 117   Ht 6' (1.829 m)   Wt 97.1 kg   BMI 29.02 kg/m   GEN: Well nourished, well developed in no acute distress NECK: No JVD; No carotid bruits CARDIAC: Irregularly irregular tachycardic rate and rhythm, no murmurs, rubs, gallops RESPIRATORY:  Clear to auscultation without rales, wheezing or rhonchi  ABDOMEN: Soft, non-tender, non-distended EXTREMITIES:  No edema; No deformity   EKG today demonstrates  Vent. rate 117 BPM PR interval * ms QRS duration 98 ms QT/QTcB 336/468 ms P-R-T axes * 35 129 Atrial fibrillation with rapid ventricular response Nonspecific ST and T wave abnormality Abnormal ECG No previous ECGs available  Echo 01/11/23 demonstrated   1. Left ventricular ejection fraction, by estimation, is 30 to 35%. The  left ventricle has moderately decreased function. The left ventricle  demonstrates global hypokinesis. Left ventricular diastolic parameters are  indeterminate.   2. Right ventricular systolic function is normal. The right ventricular  size is severely enlarged. There is mildly elevated pulmonary artery  systolic pressure. The estimated  right ventricular systolic pressure is  36.9 mmHg.   3. Left atrial size was severely dilated.   4. Right atrial size was severely dilated.   5. The mitral valve is grossly normal. Mild to moderate mitral valve  regurgitation. No evidence of mitral stenosis.   6. Tricuspid valve regurgitation is mild to moderate.   7. The aortic valve is tricuspid. Aortic valve regurgitation is mild. No  aortic  stenosis is present.   8. The inferior vena cava is dilated in size with >50% respiratory  variability, suggesting right atrial pressure of 8 mmHg.    ASSESSMENT & PLAN CHA2DS2-VASc Score = 1  The patient's score is based upon: CHF History: 0 HTN History: 0 Diabetes History: 0 Stroke History: 0 Vascular Disease History: 0 Age Score: 1 Gender Score: 0       ASSESSMENT AND PLAN: Persistent Atrial Fibrillation (ICD10:  I48.19) The patient's CHA2DS2-VASc score is 1, indicating a 0.6% annual risk of stroke.    He is in Afib with RVR.  We discussed options of treatment going forward including cardioversion, AAD therapy, and ablation. After discussion, will proceed with scheduling DCCV. If he has ERAF, will consider AAD therapy and discuss with EP if ablation is feasible given biatrial enlargement. No missed doses of Eliquis.  Informed Consent   Shared Decision Making/Informed Consent The risks (stroke, cardiac arrhythmias rarely resulting in the need for a temporary or permanent pacemaker, skin irritation or burns and complications associated with conscious sedation including aspiration, arrhythmia, respiratory failure and death), benefits (restoration of normal sinus rhythm) and alternatives of a direct current cardioversion were explained in detail to Joseph Henderson and he agrees to proceed.         Follow up 2 weeks after DCCV.    Lake Bells, PA-C  [facility] [address] [place], [place] [address] [phone]

## 2023-02-09 NOTE — Progress Notes (Signed)
Called pt to give him pre-procedure instructions, however pt did not answer. Left a voicemail and instructed pt to arrive at 1045. Also instructed pt to take eliquis in am with a small sip of water and to remain NPO after 0000. Stated that pt will need to have a ride home and someone to stay with him for 24 hours.

## 2023-02-10 ENCOUNTER — Other Ambulatory Visit: Payer: Self-pay

## 2023-02-10 ENCOUNTER — Ambulatory Visit (HOSPITAL_COMMUNITY)
Admission: RE | Admit: 2023-02-10 | Discharge: 2023-02-10 | Disposition: A | Discharge status: Home / Self Care | Payer: Medicare HMO | Attending: Cardiology | Admitting: Cardiology

## 2023-02-10 ENCOUNTER — Encounter (HOSPITAL_COMMUNITY)
Admission: RE | Disposition: A | Discharge status: Home / Self Care | Payer: Self-pay | Source: Home / Self Care | Attending: Cardiology

## 2023-02-10 ENCOUNTER — Ambulatory Visit (HOSPITAL_COMMUNITY): Payer: Medicare HMO | Admitting: Anesthesiology

## 2023-02-10 ENCOUNTER — Ambulatory Visit (HOSPITAL_BASED_OUTPATIENT_CLINIC_OR_DEPARTMENT_OTHER): Payer: Medicare HMO | Admitting: Anesthesiology

## 2023-02-10 DIAGNOSIS — I10 Essential (primary) hypertension: Secondary | ICD-10-CM | POA: Diagnosis not present

## 2023-02-10 DIAGNOSIS — I4891 Unspecified atrial fibrillation: Secondary | ICD-10-CM

## 2023-02-10 DIAGNOSIS — I4819 Other persistent atrial fibrillation: Secondary | ICD-10-CM | POA: Diagnosis not present

## 2023-02-10 HISTORY — PX: CARDIOVERSION: SHX1299

## 2023-02-10 SURGERY — CARDIOVERSION
Anesthesia: General

## 2023-02-10 MED ORDER — SODIUM CHLORIDE 0.9 % IV SOLN
INTRAVENOUS | Status: DC
  Administered 2023-02-10: 20 mL/h via INTRAVENOUS

## 2023-02-10 MED ORDER — LIDOCAINE 2% (20 MG/ML) 5 ML SYRINGE
INTRAMUSCULAR | Status: DC | PRN
  Administered 2023-02-10: 40 mg via INTRAVENOUS

## 2023-02-10 MED ORDER — PROPOFOL 10 MG/ML IV BOLUS
INTRAVENOUS | Status: DC | PRN
Start: 2023-02-10 — End: 2023-02-10
  Administered 2023-02-10: 80 mg via INTRAVENOUS
  Administered 2023-02-10: 20 mg via INTRAVENOUS

## 2023-02-10 SURGICAL SUPPLY — 1 items: ELECT DEFIB PAD ADLT CADENCE (PAD) ×1 IMPLANT

## 2023-02-10 NOTE — Interval H&P Note (Signed)
History and Physical Interval Note:  02/10/2023 11:39 AM  Joseph Henderson  has presented today for surgery, with the diagnosis of AFIB.  The various methods of treatment have been discussed with the patient and family. After consideration of risks, benefits and other options for treatment, the patient has consented to  Procedure(s): CARDIOVERSION (N/A) as a surgical intervention.  The patient's history has been reviewed, patient examined, no change in status, stable for surgery.  I have reviewed the patient's chart and labs.  Questions were answered to the patient's satisfaction.     [name]

## 2023-02-10 NOTE — Anesthesia Preprocedure Evaluation (Addendum)
Anesthesia Evaluation  Patient identified by MRN, date of birth, ID band Patient awake    Reviewed: Allergy & Precautions, NPO status , Patient's Chart, lab work & pertinent test results, reviewed documented beta blocker date and time   History of Anesthesia Complications Negative for: history of anesthetic complications  Airway Mallampati: I  TM Distance: >3 FB Neck ROM: Full    Dental  (+) Caps, Dental Advisory Given   Pulmonary neg pulmonary ROS   breath sounds clear to auscultation       Cardiovascular hypertension, Pt. on medications and Pt. on home beta blockers (-) angina + dysrhythmias Atrial Fibrillation  Rhythm:Irregular Rate:Normal  12/2022 ECHO: EF 30-35%.  1. The LV has moderately decreased function, global hypokinesis.   2. RVF is normal. The right ventricular size is severely enlarged. There is mildly elevated pulmonary artery systolic pressure. The estimated right ventricular systolic pressure is 36.9 mmHg.   3. Left atrial size was severely dilated.   4. Right atrial size was severely dilated.   5. The mitral valve is grossly normal. Mild to moderate MR. No evidence of mitral stenosis.   6. Tricuspid valve regurgitation is mild to moderate.   7. The aortic valve is tricuspid. AI is mild. No aortic stenosis is present.     Neuro/Psych negative neurological ROS     GI/Hepatic Neg liver ROS,GERD  Medicated and Controlled,,  Endo/Other  negative endocrine ROS    Renal/GU negative Renal ROS     Musculoskeletal   Abdominal   Peds  Hematology eliquis   Anesthesia Other Findings   Reproductive/Obstetrics                             Anesthesia Physical Anesthesia Plan  ASA: 3  Anesthesia Plan: General   Post-op Pain Management: Minimal or no pain anticipated   Induction: Intravenous  PONV Risk Score and Plan: 2 and Treatment may vary due to age or medical  condition  Airway Management Planned: Natural Airway and Nasal Cannula  Additional Equipment: None  Intra-op Plan:   Post-operative Plan:   Informed Consent: I have reviewed the patients History and Physical, chart, labs and discussed the procedure including the risks, benefits and alternatives for the proposed anesthesia with the patient or authorized representative who has indicated his/her understanding and acceptance.     Dental advisory given  Plan Discussed with: CRNA and Surgeon  Anesthesia Plan Comments:        Anesthesia Quick Evaluation

## 2023-02-10 NOTE — Transfer of Care (Signed)
Immediate Anesthesia Transfer of Care Note  Patient: Joseph Henderson  Procedure(s) Performed: CARDIOVERSION  Patient Location: Cath Lab  Anesthesia Type:General  Level of Consciousness: drowsy  Airway & Oxygen Therapy: Patient Spontanous Breathing and Patient connected to nasal cannula oxygen  Post-op Assessment: Report given to RN and Post -op Vital signs reviewed and stable  Post vital signs: Reviewed and stable  Last Vitals:  Vitals Value Taken Time  BP 110/75 (87)   Temp    Pulse 79   Resp 17   SpO2 94     Last Pain:  Vitals:   02/10/23 1056  TempSrc: Temporal         Complications: No notable events documented.)

## 2023-02-10 NOTE — Discharge Instructions (Signed)
 Electrical Cardioversion Electrical cardioversion is the delivery of a jolt of electricity to restore a normal rhythm to the heart. A rhythm that is too fast or is not regular (arrhythmia) keeps the heart from pumping blood well. There is also another type of cardioversion called a chemical (pharmacologic) cardioversion. This is when your health care provider gives you one or more medicines to bring back your regular heart rhythm. Electrical cardioversion is done as a scheduled procedure for arrhythmiasthat are not life-threatening. Electrical cardioversion may also be done in an emergency for sudden life-threatening arrhythmias. Tell a health care provider about: Any allergies you have. All medicines you are taking, including vitamins, herbs, eye drops, creams, and over-the-counter medicines. Any problems you or family members have had with sedatives or anesthesia. Any bleeding problems you have. Any surgeries you have had, including a pacemaker, defibrillator, or other implanted device. Any medical conditions you have. Whether you are pregnant or may be pregnant. What are the risks? Your provider will talk with you about risks. These include: Allergic reactions to medicines. Irritation to the skin on your chest or back where the sticky pads (electrodes) or paddles were put during electrical cardioversion. A blood clot that breaks free and travels to other parts of your body, such as your brain. Return of a worse abnormal heart rhythm that will need to be treated with medicines, a pacemaker, or an implantable cardioverter defibrillator (ICD). What happens before the procedure? Medicines Your provider may give you: Blood-thinning medicines (anticoagulants) so your blood does not clot as easily. If your provider gives you this medicine, you may need to take it for 4 weeks before the procedure. Medicines to help stabilize your heart rate and rhythm. Ask your provider about: Changing or stopping  your regular medicines. These include any diabetes medicines or blood thinners you take. Taking medicines such as aspirin and ibuprofen. These medicines can thin your blood. Do not take them unless your provider tells you to. Taking over-the-counter medicines, vitamins, herbs, and supplements. General instructions Follow instructions from your provider about what you may eat and drink. Do not put any lotions, powders, or ointments on your chest and back for 24 hours before the procedure. They can cause problems with the electrodes or paddles used to deliver electricity to your heart. Do not wear jewelry as this can interfere with delivering electricity to your heart. If you will be going home right after the procedure, plan to have a responsible adult: Take you home from the hospital or clinic. You will not be allowed to drive. Care for you for the time you are told. Tests You may have an exam or testing. This may include: Blood labs. A transesophageal echocardiogram (TEE). What happens during the procedure?     An IV will be inserted into one of your veins. You will be given a sedative. This helps you relax. Electrodes or metal paddles will be placed on your chest. They may be placed in one of these ways: One placed on your right chest, the other on the left ribs. One placed on your chest and the other on your back. An electrical shock will be delivered. The shock briefly stops (resets) your heart rhythm. Your provider will check to see if your heart rhythm is now normal. Some people need only one shock. Some need more to restore a normal heart rhythm. The procedure may vary among providers and hospitals. What happens after the procedure? Your blood pressure, heart rate, breathing rate, and blood oxygen  level will be monitored until you leave the hospital or clinic. Your heart rhythm will be watched to make sure it does not change. This information is not intended to replace advice  given to you by your health care provider. Make sure you discuss any questions you have with your health care provider. Document Revised: 01/29/2022 Document Reviewed: 01/29/2022 Elsevier Patient Education  2024 [employer].

## 2023-02-10 NOTE — Anesthesia Postprocedure Evaluation (Signed)
Anesthesia Post Note  Patient: Joseph Henderson  Procedure(s) Performed: CARDIOVERSION     Patient location during evaluation: Cath Lab Anesthesia Type: General Level of consciousness: awake and alert, patient cooperative and oriented Pain management: pain level controlled Vital Signs Assessment: post-procedure vital signs reviewed and stable Respiratory status: nonlabored ventilation, spontaneous breathing and respiratory function stable Cardiovascular status: stable and blood pressure returned to baseline Postop Assessment: no apparent nausea or vomiting Anesthetic complications: no   No notable events documented.  Last Vitals:  Vitals:   02/10/23 1251 02/10/23 1255  BP: 108/78 106/76  Pulse: 78 78  Resp: 19 14  Temp:    SpO2: 97% 95%    Last Pain:  Vitals:   02/10/23 1250  TempSrc: Temporal  PainSc: 0-No pain                 Brielle Moro,E. Mellina Benison

## 2023-02-10 NOTE — CV Procedure (Signed)
    Electrical Cardioversion Procedure Note Demarien Serpe [id number] 1951-06-15  Procedure: Electrical Cardioversion Indications:  Atrial Fibrillation  Time Out: Verified patient identification, verified procedure,medications/allergies/relevent history reviewed, required imaging and test results available.  Performed  Procedure Details  The patient was NPO after midnight. Anesthesia was administered at the beside  by Dr.Jackson with 40mg  Lidocaine and 100mg  of propofol.  Cardioversion was done with synchronized biphasic defibrillation with AP pads with 150watts.  The patient converted to normal sinus rhythm. The patient tolerated the procedure well   IMPRESSION:  Successful cardioversion of atrial fibrillation    Layton Naves 02/10/2023, 11:39 AM

## 2023-02-11 ENCOUNTER — Encounter (HOSPITAL_COMMUNITY): Payer: Self-pay | Admitting: Cardiology

## 2023-02-11 NOTE — Addendum Note (Signed)
Addendum  created 02/11/23 1329 by Jairo Ben, MD   Attestation recorded in Intraprocedure, Intraprocedure Attestations filed, Intraprocedure Event edited

## 2023-02-15 DIAGNOSIS — R7989 Other specified abnormal findings of blood chemistry: Secondary | ICD-10-CM | POA: Diagnosis not present

## 2023-02-15 DIAGNOSIS — Z125 Encounter for screening for malignant neoplasm of prostate: Secondary | ICD-10-CM | POA: Diagnosis not present

## 2023-02-15 DIAGNOSIS — Z1212 Encounter for screening for malignant neoplasm of rectum: Secondary | ICD-10-CM | POA: Diagnosis not present

## 2023-02-16 ENCOUNTER — Ambulatory Visit: Payer: Medicare HMO | Admitting: Physician Assistant

## 2023-02-16 ENCOUNTER — Telehealth: Payer: Self-pay

## 2023-02-16 NOTE — Telephone Encounter (Signed)
Left message for pt. Offered to move appointment to 9am tomorrow with Dr. Allyson Sabal. Left call back number for pt.

## 2023-02-17 ENCOUNTER — Ambulatory Visit: Payer: Medicare HMO | Attending: Cardiology | Admitting: Cardiovascular Disease

## 2023-02-17 ENCOUNTER — Encounter: Payer: Self-pay | Admitting: Cardiovascular Disease

## 2023-02-17 VITALS — BP 118/72 | HR 67 | Ht 72.0 in | Wt 216.6 lb

## 2023-02-17 DIAGNOSIS — I519 Heart disease, unspecified: Secondary | ICD-10-CM | POA: Diagnosis not present

## 2023-02-17 DIAGNOSIS — I739 Peripheral vascular disease, unspecified: Secondary | ICD-10-CM | POA: Insufficient documentation

## 2023-02-17 DIAGNOSIS — Z1339 Encounter for screening examination for other mental health and behavioral disorders: Secondary | ICD-10-CM | POA: Diagnosis not present

## 2023-02-17 DIAGNOSIS — Z1331 Encounter for screening for depression: Secondary | ICD-10-CM | POA: Diagnosis not present

## 2023-02-17 DIAGNOSIS — E663 Overweight: Secondary | ICD-10-CM | POA: Diagnosis not present

## 2023-02-17 DIAGNOSIS — R82998 Other abnormal findings in urine: Secondary | ICD-10-CM | POA: Diagnosis not present

## 2023-02-17 DIAGNOSIS — Z0000 Encounter for general adult medical examination without abnormal findings: Secondary | ICD-10-CM | POA: Diagnosis not present

## 2023-02-17 DIAGNOSIS — I480 Paroxysmal atrial fibrillation: Secondary | ICD-10-CM | POA: Diagnosis not present

## 2023-02-17 DIAGNOSIS — K219 Gastro-esophageal reflux disease without esophagitis: Secondary | ICD-10-CM | POA: Diagnosis not present

## 2023-02-17 NOTE — Patient Instructions (Addendum)
Melissa medication Instructions:  Your physician recommends that you continue on your current medications as directed. Please refer to the Current Medication list given to you today.  *If you need a refill on your cardiac medications before your next appointment, please call your pharmacy*     Testing/Procedures: Your physician has requested that you have an ankle brachial index (ABI). During this test an ultrasound and blood pressure cuff are used to evaluate the arteries that supply the arms and legs with blood. Allow thirty minutes for this exam. There are no restrictions or special instructions. This will take place at [address] [facility], [address].      Follow-Up: At [facility], you and your health needs are our priority.  As part of our continuing mission to provide you with exceptional heart care, we have created designated Provider Care Teams.  These Care Teams include your primary Cardiologist (physician) and Advanced Practice Providers (APPs -  Physician Assistants and Nurse Practitioners) who all work together to provide you with the care you need, when you need it.  We recommend signing up for the patient portal called "MyChart".  Sign up information is provided on this After Visit Summary.  MyChart is used to connect with patients for Virtual Visits (Telemedicine).  Patients are able to view lab/test results, encounter notes, upcoming appointments, etc.  Non-urgent messages can be sent to your provider as well.   To learn more about what you can do with MyChart, go to [email or web address].    Your next appointment:   We will see you on an as needed basis.   Provider:   Nanetta Batty, MD

## 2023-02-17 NOTE — Progress Notes (Signed)
02/17/2023 Joseph Henderson   [birth year]  [id number]  Primary Physician Geoffry Paradise, MD Primary Cardiologist: Runell Gess MD Nicholes Calamity, [place]  HPI:  Joseph Henderson is a 72 y.o. thin-appearing married Caucasian male with no children he is retired from being in [employer].  He was a patient of Dr. Erskine Emery Smith's before he retired.  He currently sees Dr. Lorain Childes for primary care.  He basically has no cardiac risk factors.  He is very active, has a trainer and walks on a regular basis without symptoms.  There is no family history of heart disease.  He has never had a heart attack or stroke.  Denies chest pain or shortness of breath.  He was found to be in A-fib by a insurance nurse and underwent successful DC cardioversion by Dr. Carolanne Grumbling 1 week ago to sinus rhythm.  He is on Eliquis oral anticoagulation.  A 2D echo performed 01/11/2023 revealed an EF in the 30% range though he is completely asymptomatic on a beta-blocker.  He scheduled to see Dr. Gala Romney in the office in the near future for further evaluation of this.   Current Meds  Medication Sig   apixaban (ELIQUIS) 5 MG TABS tablet Take 1 tablet (5 mg total) by mouth 2 (two) times daily.   aspirin EC 81 MG tablet Take 81 mg by mouth at bedtime. Swallow whole.   Cetirizine HCl (ZYRTEC ALLERGY) 10 MG CAPS Take 10 mg by mouth every evening.   Glucosamine-Chondroit-Vit C-Mn (GLUCOSAMINE CHONDR 1500 COMPLX PO) Take 1 tablet by mouth 2 (two) times daily.   Linoleic Acid Conjugated 1200 MG CAPS Take 2,400 mg by mouth daily.   melatonin 5 MG TABS Take 5 mg by mouth at bedtime.   metoprolol tartrate (LOPRESSOR) 25 MG tablet Take 0.5 tablets (12.5 mg total) by mouth 2 (two) times daily.   Misc Natural Products (IMMUNE FORMULA PO) Take 500 mg by mouth daily. EpiCor 500 mg   Multiple Vitamin (MULTIVITAMIN ADULT) TABS Take 1 tablet by mouth every evening.   omeprazole (PRILOSEC) 20 MG capsule Take 20 mg by mouth every morning.    oxymetazoline (AFRIN) 0.05 % nasal spray Place 1 spray into both nostrils 2 (two) times daily as needed for congestion.   TURMERIC CURCUMIN PO Take 1,000 mg by mouth daily.     Allergies  Allergen Reactions   Amoxicillin Hives and Rash    Social History   Socioeconomic History   Marital status: Married    Spouse name: Not on file   Number of children: Not on file   Years of education: Not on file   Highest education level: Not on file  Occupational History   Not on file  Tobacco Use   Smoking status: Never   Smokeless tobacco: Never   Tobacco comments:    Never smoked 02/09/23  Substance and Sexual Activity   Alcohol use: Yes    Alcohol/week: 8.0 standard drinks of alcohol    Types: 8 Glasses of wine per week    Comment: 4 glasses of wine or mixed drinks a week 02/09/23   Drug use: No   Sexual activity: Not on file  Other Topics Concern   Not on file  Social History Narrative   Not on file   Social Determinants of Health   Financial Resource Strain: Not on file  Food Insecurity: Not on file  Transportation Needs: Not on file  Physical Activity: Not on file  Stress: Not on  file  Social Connections: Not on file  Intimate Partner Violence: Not on file     Review of Systems: General: negative for chills, fever, night sweats or weight changes.  Cardiovascular: negative for chest pain, dyspnea on exertion, edema, orthopnea, palpitations, paroxysmal nocturnal dyspnea or shortness of breath Dermatological: negative for rash Respiratory: negative for cough or wheezing Urologic: negative for hematuria Abdominal: negative for nausea, vomiting, diarrhea, bright red blood per rectum, melena, or hematemesis Neurologic: negative for visual changes, syncope, or dizziness All other systems reviewed and are otherwise negative except as noted above.    Blood pressure 118/72, pulse 67, height 6' (1.829 m), weight 216 lb 9.6 oz (98.2 kg), SpO2 97%.  General appearance: alert  and no distress Neck: no adenopathy, no carotid bruit, no JVD, supple, symmetrical, trachea midline, and thyroid not enlarged, symmetric, no tenderness/mass/nodules Lungs: clear to auscultation bilaterally Heart: Regular rate and rhythm without murmurs gallops rubs or clicks Extremities: extremities normal, atraumatic, no cyanosis or edema Pulses: 2+ and symmetric Skin: Skin color, texture, turgor normal. No rashes or lesions Neurologic: Grossly normal  EKG not performed today      ASSESSMENT AND PLAN:   Peripheral arterial disease [facility]) Mr. Mceuen was referred to me by Jari Favre, PA-C for evaluation of PAD.  He apparently had ABIs done as part of outpatient physical that were mildly diminished.  He is very active and has no claudication.  I am going to get repeat ABIs in our office.  I do not think further evaluation is warranted at this time.     Runell Gess MD FACP,FACC,FAHA, [facility] 02/17/2023 11:54 AM

## 2023-02-17 NOTE — Assessment & Plan Note (Signed)
Mr. Donais was referred to me by Jari Favre, PA-C for evaluation of PAD.  He apparently had ABIs done as part of outpatient physical that were mildly diminished.  He is very active and has no claudication.  I am going to get repeat ABIs in our office.  I do not think further evaluation is warranted at this time.

## 2023-03-03 ENCOUNTER — Ambulatory Visit (HOSPITAL_COMMUNITY)
Admission: RE | Admit: 2023-03-03 | Discharge: 2023-03-03 | Disposition: A | Discharge status: Home / Self Care | Payer: Medicare HMO | Source: Ambulatory Visit | Attending: Internal Medicine | Admitting: Internal Medicine

## 2023-03-03 ENCOUNTER — Encounter (HOSPITAL_COMMUNITY): Payer: Self-pay | Admitting: Internal Medicine

## 2023-03-03 VITALS — BP 122/90 | HR 104 | Ht 72.0 in | Wt 217.6 lb

## 2023-03-03 DIAGNOSIS — I480 Paroxysmal atrial fibrillation: Secondary | ICD-10-CM | POA: Diagnosis not present

## 2023-03-03 DIAGNOSIS — R9431 Abnormal electrocardiogram [ECG] [EKG]: Secondary | ICD-10-CM | POA: Insufficient documentation

## 2023-03-03 DIAGNOSIS — Z7901 Long term (current) use of anticoagulants: Secondary | ICD-10-CM | POA: Diagnosis not present

## 2023-03-03 DIAGNOSIS — I4891 Unspecified atrial fibrillation: Secondary | ICD-10-CM

## 2023-03-03 DIAGNOSIS — I4819 Other persistent atrial fibrillation: Secondary | ICD-10-CM

## 2023-03-03 DIAGNOSIS — Z79899 Other long term (current) drug therapy: Secondary | ICD-10-CM | POA: Insufficient documentation

## 2023-03-03 MED ORDER — METOPROLOL TARTRATE 25 MG PO TABS: 25.0000 mg | ORAL_TABLET | Freq: Two times a day (BID) | ORAL | 3 refills | Status: DC

## 2023-03-03 MED ORDER — APIXABAN 5 MG PO TABS: 5.0000 mg | ORAL_TABLET | Freq: Two times a day (BID) | ORAL | 2 refills | Status: DC

## 2023-03-03 MED ORDER — AMIODARONE HCL 200 MG PO TABS: ORAL_TABLET | ORAL | 3 refills | Status: DC

## 2023-03-03 NOTE — Progress Notes (Addendum)
Primary Care Physician: Geoffry Paradise, MD Primary Cardiologist: None Electrophysiologist: None     Referring Physician: Jari Favre, PA-C     Joseph Henderson is a 72 y.o. male with a new history of paroxysmal atrial fibrillation who presents for consultation in the [facility]. History of coronary calcium score of 0 in 09/2021. Seen by Cardiology on 12/15/22 due to home health RN appreciating an abnormal heart rhythm per report. He was noted to be in Afib with RVR. Started on metoprolol 12.5 mg BID. Patient is on Eliquis 5 mg BID for a CHADS2VASC score of 1.  On evaluation today, he is currently in Afib with RVR. No missed doses of Eliquis. He has an Scientist, physiological. He does not drink excessive caffeine or alcohol. He doesn't believe he snores but not completely sure.  On follow up 03/03/23, he is currently in Afib with RVR. S/p successful DCCV on 02/10/23. He notes his watch stated he went into Afib yesterday. He feels fatigued. He missed two doses of Eliquis on 8/31 fully anticoagulated since then. He has upcoming appt with HF clinic Dr. Gala Romney.   Today, he denies symptoms of palpitations, chest pain, shortness of breath, orthopnea, PND, lower extremity edema, dizziness, presyncope, syncope, snoring, daytime somnolence, bleeding, or neurologic sequela. The patient is tolerating medications without difficulties and is otherwise without complaint today.    Atrial Fibrillation Risk Factors:  he is unsure if he has symptoms or diagnosis of sleep apnea.   he has a BMI of Body mass index is 29.51 kg/m.Marland Kitchen Filed Weights   03/03/23 0933  Weight: 98.7 kg     Current Outpatient Medications  Medication Sig Dispense Refill   acetaminophen (TYLENOL) 500 MG tablet Take 1,000 mg by mouth every 6 (six) hours as needed for moderate pain.     amiodarone (PACERONE) 200 MG tablet Take 1 tablet (200 mg total) by mouth 2 (two) times daily AND 1 tablet (200 mg total) daily. 90  tablet 3   aspirin EC 81 MG tablet Take 81 mg by mouth at bedtime. Swallow whole.     Cetirizine HCl (ZYRTEC ALLERGY) 10 MG CAPS Take 10 mg by mouth every evening.     fluoruracil (CARAC) 0.5 % cream Apply 1 Application topically daily as needed (skin cancer).     fluticasone (KLS ALLER-FLO) 50 MCG/ACT nasal spray Place 2 sprays into both nostrils daily as needed for allergies or rhinitis.     Glucosamine-Chondroit-Vit C-Mn (GLUCOSAMINE CHONDR 1500 COMPLX PO) Take 1 tablet by mouth 2 (two) times daily.     ibuprofen (ADVIL) 200 MG tablet Take 400 mg by mouth every 8 (eight) hours as needed for moderate pain.     Linoleic Acid Conjugated 1200 MG CAPS Take 2,400 mg by mouth daily.     melatonin 5 MG TABS Take 5 mg by mouth at bedtime.     Misc Natural Products (IMMUNE FORMULA PO) Take 500 mg by mouth daily. EpiCor 500 mg     Multiple Vitamin (MULTIVITAMIN ADULT) TABS Take 1 tablet by mouth every evening.     omeprazole (PRILOSEC) 20 MG capsule Take 20 mg by mouth every morning.     oxymetazoline (AFRIN) 0.05 % nasal spray Place 1 spray into both nostrils 2 (two) times daily as needed for congestion.     TURMERIC CURCUMIN PO Take 1,000 mg by mouth daily.     apixaban (ELIQUIS) 5 MG TABS tablet Take 1 tablet (5 mg total) by mouth  2 (two) times daily. 180 tablet 2   metoprolol tartrate (LOPRESSOR) 25 MG tablet Take 1 tablet (25 mg total) by mouth 2 (two) times daily. 60 tablet 3   No current facility-administered medications for this encounter.    Atrial Fibrillation Management history:  Previous antiarrhythmic drugs: None Previous cardioversions: 02/10/23 Previous ablations: None Anticoagulation history: Eliquis   ROS- All systems are reviewed and negative except as per the HPI above.  Physical Exam: BP (!) 122/90   Pulse (!) 104   Ht 6' (1.829 m)   Wt 98.7 kg   BMI 29.51 kg/m   GEN- The patient is well appearing, alert and oriented x 3 today.   Neck - no JVD or carotid bruit  noted Lungs- Clear to ausculation bilaterally, normal work of breathing Heart- Irregular rate and rhythm, no murmurs, rubs or gallops, PMI not laterally displaced Extremities- no clubbing, cyanosis, or edema Skin - no rash or ecchymosis noted   EKG today demonstrates  Vent. rate 104 BPM PR interval * ms QRS duration 100 ms QT/QTcB 344/452 ms P-R-T axes * 42 71 Atrial fibrillation with rapid ventricular response Nonspecific ST abnormality Abnormal ECG When compared with ECG of 10-Feb-2023 12:48, PREVIOUS ECG IS PRESENT  Echo 01/11/23 demonstrated   1. Left ventricular ejection fraction, by estimation, is 30 to 35%. The  left ventricle has moderately decreased function. The left ventricle  demonstrates global hypokinesis. Left ventricular diastolic parameters are  indeterminate.   2. Right ventricular systolic function is normal. The right ventricular  size is severely enlarged. There is mildly elevated pulmonary artery  systolic pressure. The estimated right ventricular systolic pressure is  36.9 mmHg.   3. Left atrial size was severely dilated.   4. Right atrial size was severely dilated.   5. The mitral valve is grossly normal. Mild to moderate mitral valve  regurgitation. No evidence of mitral stenosis.   6. Tricuspid valve regurgitation is mild to moderate.   7. The aortic valve is tricuspid. Aortic valve regurgitation is mild. No  aortic stenosis is present.   8. The inferior vena cava is dilated in size with >50% respiratory  variability, suggesting right atrial pressure of 8 mmHg.    ASSESSMENT & PLAN CHA2DS2-VASc Score = 1  The patient's score is based upon: CHF History: 0 HTN History: 0 Diabetes History: 0 Stroke History: 0 Vascular Disease History: 0 Age Score: 1 Gender Score: 0       ASSESSMENT AND PLAN: Persistent Atrial Fibrillation (ICD10:  I48.19) The patient's CHA2DS2-VASc score is 1, indicating a 0.6% annual risk of stroke.   S/p successful DCCV  on 02/10/23.   He is in Afib with RVR. Discussion of rhythm control options going forward. After discussion, patient agrees to begin medication as a bridge and interested in ablation. I will refer to EP to discuss ablation and he will begin amiodarone on 9/23, repeat ECG 2 weeks here or with EP. Take metoprolol 25 mg BID; this may be need to be decreased once he is back in NSR. He will begin amiodarone 200 mg BID x 1 month then transition to 200 mg once daily.   Will refer to EP to discuss ablation.    [place] Bells, PA-C  Afib Clinic [facility] [address] [place], [place] [address] [phone]

## 2023-03-03 NOTE — Patient Instructions (Addendum)
On September 23rd ---- Start Amiodarone 200mg  twice a day for 30 days then reduce to once a day going forward -- take with food   Increase metoprolol to 25mg  twice a day

## 2023-03-04 ENCOUNTER — Other Ambulatory Visit (HOSPITAL_COMMUNITY): Payer: Self-pay | Admitting: Internal Medicine

## 2023-03-12 ENCOUNTER — Ambulatory Visit (HOSPITAL_COMMUNITY)
Admission: RE | Admit: 2023-03-12 | Discharge: 2023-03-12 | Disposition: A | Discharge status: Home / Self Care | Payer: Medicare HMO | Source: Ambulatory Visit | Attending: Cardiovascular Disease | Admitting: Cardiovascular Disease

## 2023-03-12 DIAGNOSIS — I739 Peripheral vascular disease, unspecified: Secondary | ICD-10-CM | POA: Insufficient documentation

## 2023-03-12 LAB — VAS US LOWER EXT ART SEG MULTI (SEGMENTALS & LE RAYNAUDS)
Left ABI: 1.3
Right ABI: 1.3

## 2023-03-17 ENCOUNTER — Telehealth (HOSPITAL_COMMUNITY): Payer: Self-pay | Admitting: *Deleted

## 2023-03-17 MED ORDER — METOPROLOL TARTRATE 25 MG PO TABS: 12.5000 mg | ORAL_TABLET | Freq: Two times a day (BID) | ORAL | Status: DC

## 2023-03-17 MED ORDER — AMIODARONE HCL 200 MG PO TABS: ORAL_TABLET | ORAL | Status: DC

## 2023-03-17 NOTE — Telephone Encounter (Signed)
Patient started amiodarone on 9/23 - this morning his watch notified him of more than 10 minutes of heart rates of 40. Pt overall asymptomatic with this. Discussed with Landry Mellow PA will decrease metoprolol to 12.5mg  BID continue amiodarone load. Pt will call with update of HRs on Friday.

## 2023-03-19 NOTE — Telephone Encounter (Signed)
Patient feeling "normal" since decreasing metoprolol. Heart rates in the 75 range since decreasing metoprolol to 12.5mg  bid. Pt will call if issues arise with low heart rates as amiodarone continues to load.

## 2023-03-21 NOTE — Progress Notes (Incomplete)
ADVANCED HF CLINIC CONSULT NOTE  Referring Physician: Geoffry Paradise, MD Primary Care: Geoffry Paradise, MD Primary Cardiologist: Dr. Allyson Sabal (formerly Garnette Scheuermann. MD)  HPI:  Joseph Henderson is a 72 y.o.male with h/o atrial fibrillation and cardiomyopathy who is referred by Dr. Allyson Sabal for further evaluation of his cardiomyopathy.   He has no h/o cardiac risk factors and has been very active. CAC = 0 in 8.23  He was found to be in A-fib by a insurance nurse in 6/24. He was referred to Cardiology. He was noted to be in AF with RVR. Started on metoprolol and Eliquis. Referred to EP and echo ordered  Echo 01/11/23 showed EF 30-35% RV moderately enlarged/moderate HK with severe biatrial enlargement.   Seen in AF Clinic on 02/09/23 and was still in AF with RVR at rate 117.   He underwent successful DC cardioversion by Dr. Mayford Knife on 02/10/23   Was seen back in AF clinic on 03/03/23 and was back in AF (based on AppleWatch AF returned on 03/02/23). Amiodarone started on 03/15/23 and referred for ablation.    Review of Systems: [y] = yes, [ ]  = no   General: Weight gain [ ] ; Weight loss [ ] ; Anorexia [ ] ; Fatigue [ ] ; Fever [ ] ; Chills [ ] ; Weakness [ ]   Cardiac: Chest pain/pressure [ ] ; Resting SOB [ ] ; Exertional SOB [ ] ; Orthopnea [ ] ; Pedal Edema [ ] ; Palpitations [ ] ; Syncope [ ] ; Presyncope [ ] ; Paroxysmal nocturnal dyspnea[ ]   Pulmonary: Cough [ ] ; Wheezing[ ] ; Hemoptysis[ ] ; Sputum [ ] ; Snoring [ ]   GI: Vomiting[ ] ; Dysphagia[ ] ; Melena[ ] ; Hematochezia [ ] ; Heartburn[ ] ; Abdominal pain [ ] ; Constipation [ ] ; Diarrhea [ ] ; BRBPR [ ]   GU: Hematuria[ ] ; Dysuria [ ] ; Nocturia[ ]   Vascular: Pain in legs with walking [ ] ; Pain in feet with lying flat [ ] ; Non-healing sores [ ] ; Stroke [ ] ; TIA [ ] ; Slurred speech [ ] ;  Neuro: Headaches[ ] ; Vertigo[ ] ; Seizures[ ] ; Paresthesias[ ] ;Blurred vision [ ] ; Diplopia [ ] ; Vision changes [ ]   Ortho/Skin: Arthritis [ ] ; Joint pain [ ] ; Muscle pain [ ] ; Joint  swelling [ ] ; Back Pain [ ] ; Rash [ ]   Psych: Depression[ ] ; Anxiety[ ]   Heme: Bleeding problems [ ] ; Clotting disorders [ ] ; Anemia [ ]   Endocrine: Diabetes [ ] ; Thyroid dysfunction[ ]    No past medical history on file.  Current Outpatient Medications  Medication Sig Dispense Refill   acetaminophen (TYLENOL) 500 MG tablet Take 1,000 mg by mouth every 6 (six) hours as needed for moderate pain.     amiodarone (PACERONE) 200 MG tablet Take 1 tablet (200 mg total) by mouth 2 (two) times daily for 30 days, THEN 1 tablet (200 mg total) daily.     apixaban (ELIQUIS) 5 MG TABS tablet Take 1 tablet (5 mg total) by mouth 2 (two) times daily. 180 tablet 2   aspirin EC 81 MG tablet Take 81 mg by mouth at bedtime. Swallow whole.     Cetirizine HCl (ZYRTEC ALLERGY) 10 MG CAPS Take 10 mg by mouth every evening.     fluoruracil (CARAC) 0.5 % cream Apply 1 Application topically daily as needed (skin cancer).     fluticasone (KLS ALLER-FLO) 50 MCG/ACT nasal spray Place 2 sprays into both nostrils daily as needed for allergies or rhinitis.     Glucosamine-Chondroit-Vit C-Mn (GLUCOSAMINE CHONDR 1500 COMPLX PO) Take 1 tablet by mouth 2 (two) times daily.  ibuprofen (ADVIL) 200 MG tablet Take 400 mg by mouth every 8 (eight) hours as needed for moderate pain.     Linoleic Acid Conjugated 1200 MG CAPS Take 2,400 mg by mouth daily.     melatonin 5 MG TABS Take 5 mg by mouth at bedtime.     metoprolol tartrate (LOPRESSOR) 25 MG tablet Take 0.5 tablets (12.5 mg total) by mouth 2 (two) times daily.     Misc Natural Products (IMMUNE FORMULA PO) Take 500 mg by mouth daily. EpiCor 500 mg     Multiple Vitamin (MULTIVITAMIN ADULT) TABS Take 1 tablet by mouth every evening.     omeprazole (PRILOSEC) 20 MG capsule Take 20 mg by mouth every morning.     oxymetazoline (AFRIN) 0.05 % nasal spray Place 1 spray into both nostrils 2 (two) times daily as needed for congestion.     TURMERIC CURCUMIN PO Take 1,000 mg by mouth  daily.     No current facility-administered medications for this encounter.    Allergies  Allergen Reactions   Amoxicillin Hives and Rash      Social History   Socioeconomic History   Marital status: Married    Spouse name: Not on file   Number of children: Not on file   Years of education: Not on file   Highest education level: Not on file  Occupational History   Not on file  Tobacco Use   Smoking status: Never   Smokeless tobacco: Never   Tobacco comments:    Never smoked 02/09/23  Substance and Sexual Activity   Alcohol use: Yes    Alcohol/week: 8.0 standard drinks of alcohol    Types: 8 Glasses of wine per week    Comment: 4 glasses of wine or mixed drinks a week 02/09/23   Drug use: No   Sexual activity: Not on file  Other Topics Concern   Not on file  Social History Narrative   Not on file   Social Determinants of Health   Financial Resource Strain: Not on file  Food Insecurity: Not on file  Transportation Needs: Not on file  Physical Activity: Not on file  Stress: Not on file  Social Connections: Not on file  Intimate Partner Violence: Not on file     No family history on file.  There were no vitals filed for this visit.  PHYSICAL EXAM: General:  Well appearing. No respiratory difficulty HEENT: normal Neck: supple. no JVD. Carotids 2+ bilat; no bruits. No lymphadenopathy or thryomegaly appreciated. Cor: PMI nondisplaced. Regular rate & rhythm. No rubs, gallops or murmurs. Lungs: clear Abdomen: soft, nontender, nondistended. No hepatosplenomegaly. No bruits or masses. Good bowel sounds. Extremities: no cyanosis, clubbing, rash, edema Neuro: alert & oriented x 3, cranial nerves grossly intact. moves all 4 extremities w/o difficulty. Affect pleasant.  ECG:   ASSESSMENT & PLAN:  1. Chronic systolic HF  - Echo 01/11/23 EF 30-35% RV moderately enlarged/HK. Severe biatrial enlargement - Suspect tachy-mediated CM in setting of AF but with  significant RV enlargement and severe biartrial enlargement also raises issue of underlying restrictive CM though LV does not appear thick. No obvious evidence of  shunting - No clear RFs for iCM and CAC -0 in 4/23 so iCM unlikely - Will start with cMRI to reassess EF and look for structural abnormalities . NYHA II - Volume status ok   2. Persistent AF - discovered incidentally in 5/24 - s/p DC-CV on 02/10/23 with recurrence of AF 03/03/23 - Followed by  AF Clinic. Amio started 03/15/23 - Remains in AF currently.  - On Eliquis 5 bid - Pending EP evaluation for ablation -    Arvilla Meres, MD  10:54 PM

## 2023-03-22 ENCOUNTER — Encounter (HOSPITAL_COMMUNITY): Payer: Self-pay | Admitting: Internal Medicine

## 2023-03-22 ENCOUNTER — Ambulatory Visit (HOSPITAL_COMMUNITY)
Admission: RE | Admit: 2023-03-22 | Discharge: 2023-03-22 | Disposition: A | Discharge status: Home / Self Care | Payer: Medicare HMO | Source: Ambulatory Visit | Attending: Internal Medicine | Admitting: Internal Medicine

## 2023-03-22 VITALS — BP 120/78 | HR 101 | Wt 218.0 lb

## 2023-03-22 DIAGNOSIS — Z79899 Other long term (current) drug therapy: Secondary | ICD-10-CM | POA: Diagnosis not present

## 2023-03-22 DIAGNOSIS — Z7901 Long term (current) use of anticoagulants: Secondary | ICD-10-CM | POA: Insufficient documentation

## 2023-03-22 DIAGNOSIS — G4719 Other hypersomnia: Secondary | ICD-10-CM | POA: Diagnosis not present

## 2023-03-22 DIAGNOSIS — I519 Heart disease, unspecified: Secondary | ICD-10-CM

## 2023-03-22 DIAGNOSIS — I5022 Chronic systolic (congestive) heart failure: Secondary | ICD-10-CM | POA: Insufficient documentation

## 2023-03-22 DIAGNOSIS — I4819 Other persistent atrial fibrillation: Secondary | ICD-10-CM

## 2023-03-22 MED ORDER — LOSARTAN POTASSIUM 25 MG PO TABS: 25.0000 mg | ORAL_TABLET | Freq: Every day | ORAL | 3 refills | Status: DC

## 2023-03-22 MED ORDER — METOPROLOL SUCCINATE ER 50 MG PO TB24: 50.0000 mg | ORAL_TABLET | Freq: Every day | ORAL | 3 refills | Status: DC

## 2023-03-22 NOTE — Progress Notes (Signed)
Height:  6'    Weight: 218 lb BMI: 29.57  Today's Date: 03/22/23  STOP BANG RISK ASSESSMENT S (snore) Have you been told that you snore?     YES   T (tired) Are you often tired, fatigued, or sleepy during the day?   YES  O (obstruction) Do you stop breathing, choke, or gasp during sleep? NO   P (pressure) Do you have or are you being treated for high blood pressure? NO   B (BMI) Is your body index greater than 35 kg/m? NO   A (age) Are you 72 years old or older? YES   N (neck) Do you have a neck circumference greater than 16 inches?      G (gender) Are you a male? YES   TOTAL STOP/BANG "YES" ANSWERS 4                                                                       For Office Use Only              Procedure Order Form    YES to 3+ Stop Bang questions OR two clinical symptoms - patient qualifies for WatchPAT (CPT 95800)      Clinical Notes: Will consult Sleep Specialist and refer for management of therapy due to patient increased risk of Sleep Apnea. Ordering a sleep study due to the following two clinical symptoms: Excessive daytime sleepiness G47.10 / Loud snoring R06.83

## 2023-03-22 NOTE — Patient Instructions (Addendum)
Medication Changes:  STOP Metoprolol tartrate  START Metoprolol Succinate 50 mg Daily  START Losartan 25 mg Daily  Lab Work:  none  Testing/Procedures:  Your provider has recommended that you have a home sleep study ([facility]amar Test).  We have provided you with the equipment in our office today. Please go ahead and download the app. DO NOT OPEN OR TAMPER WITH THE BOX UNTIL WE ADVISE YOU TO DO SO. Once insurance has approved the test our office will call you with PIN number and approval to proceed with testing. Once you have completed the test you just dispose of the equipment, the information is automatically uploaded to [place] via blue-tooth technology. If your test is positive for sleep apnea and you need a home CPAP machine you will be contacted by Dr Norris Cross office [facility]) to set this up.  Your physician has requested that you have a cardiac MRI. Cardiac MRI uses a computer to create images of your heart as its beating, producing both still and moving pictures of your heart and major blood vessels. For further information please visit [email or web address]. Please follow the instruction below. You will be called to schedule this  Referrals:  none  Special Instructions // Education:  Do the following things EVERYDAY: Weigh yourself in the morning before breakfast. Write it down and keep it in a log. Take your medicines as prescribed Eat low salt foods--Limit salt (sodium) to 2000 mg per day.  Stay as active as you can everyday Limit all fluids for the day to less than 2 liters    You are scheduled for Cardiac MRI on ______________. Please arrive for your appointment at ______________ ( arrive 30-45 minutes prior to test start time). ?  [facility] [address] [place], [place] [address] [phone] Please take advantage of the free valet parking available at the [facility] [employer] (Entrance C).  Proceed to the [facility] Radiology Department  (First Floor) for check-in.    Magnetic resonance imaging (MRI) is a painless test that produces images of the inside of the body without using Xrays.  During an MRI, strong magnets and radio waves work together in a Data processing manager to form detailed images.   MRI images may provide more details about a medical condition than X-rays, CT scans, and ultrasounds can provide.  You may be given earphones to listen for instructions.  You may eat a light breakfast and take medications as ordered with the exception of furosemide, hydrochlorothiazide, or spironolactone(fluid pill, other). Please avoid stimulants for 12 hr prior to test. (Ie. Caffeine, nicotine, chocolate, or antihistamine medications)  An IV will be inserted into one of your veins. Contrast material will be injected into your IV. It will leave your body through your urine within a day. You may be told to drink plenty of fluids to help flush the contrast material out of your system.  You will be asked to remove all metal, including: Watch, jewelry, and other metal objects including hearing aids, hair pieces and dentures. Also wearable glucose monitoring systems (ie. Freestyle Libre and Omnipods) (Braces and fillings normally are not a problem.)   TEST WILL TAKE APPROXIMATELY 1 HOUR  PLEASE NOTIFY SCHEDULING AT LEAST 24 HOURS IN ADVANCE IF YOU ARE UNABLE TO KEEP YOUR APPOINTMENT. [phone]  For more information and frequently asked questions, please visit our website : [email or web address]  Please call the Cardiac Imaging Nurse Navigators with any questions/concerns. [phone] Office    Follow-Up in:   Please  follow up with our heart failure pharmacist every 2-3 weeks for 3 visits  Your physician recommends that you schedule a follow-up appointment in: 3 months   At the [facility], you and your health needs are our priority. We have a designated team specialized in the treatment of Heart  Failure. This Care Team includes your primary Heart Failure Specialized Cardiologist (physician), Advanced Practice Providers (APPs- Physician Assistants and Nurse Practitioners), and Pharmacist who all work together to provide you with the care you need, when you need it.   You may see any of the following providers on your designated Care Team at your next follow up:  Dr. Arvilla Meres Dr. Marca Ancona Dr. Marcos Eke, NP Robbie Lis, [place] [facility] [place], [place] Brynda Peon, NP Karle Plumber, PharmD   Please be sure to bring in all your medications bottles to every appointment.   Need to Contact us:  If you have any questions or concerns before your next appointment please send [place] a message through [place] or call our office at [phone].    TO LEAVE A MESSAGE FOR THE NURSE SELECT OPTION 2, PLEASE LEAVE A MESSAGE INCLUDING: YOUR NAME DATE OF BIRTH CALL BACK NUMBER REASON FOR CALL**this is important as we prioritize the call backs  YOU WILL RECEIVE A CALL BACK THE SAME DAY AS LONG AS YOU CALL BEFORE 4:00 PM

## 2023-03-27 NOTE — Progress Notes (Unsigned)
Electrophysiology Office Note:   Date:  03/29/2023  ID:  Joseph Henderson, DOB 16-Jul-1950, MRN [id number]  Primary Cardiologist: None Electrophysiologist: Nobie Putnam, MD      History of Present Illness:   Joseph Henderson is a 72 y.o. male with h/o persistent atrial fibrillation and chronic systolic heart failure secondary to likely tachycardia/arrhythmia induced cardiomyopathy seen today for Electrophysiology evaluation of atrial fibrillation at the request of Dr. Gala Romney.   Patient reportedly had new diagnosis of atrial fibrillation in June of this year after a home health nurse discovered that his heart rate was irregular with auscultation.  Echocardiogram was performed which showed a newly discovered LVEF of 30 to 35%.  He underwent cardioversion on 02/10/2023.  He reportedly went back into atrial fibrillation on 03/02/2023, which was detected by his Apple Watch.  Patient reports feeling better after his cardioversion, while in normal sinus rhythm.  His primary symptoms are fatigue and occasionally has palpitations.  He denies chest pain, shortness of breath, lower extremity edema.  Review of systems complete and found to be negative unless listed in HPI.   EP Information / Studies Reviewed:    EKG is not ordered today. EKG from 03/22/23 reviewed which showed atrial fibrillation with RVR.      Echocardiogram 01/11/23: Moderately decreased LV function, LVEF 30 to 35%.  Global hypokinesis. Normal RV function.  Severely enlarged RV size.  Mildly elevated pulmonary artery systolic pressure.  RVSP estimated 37 mmHg. Severely dilated dilated right and left atrium. No significant valvular disease.   EKG 12/15/22   09/23/21 Coronary calcium score of 0.  Risk Assessment/Calculations:    CHA2DS2-VASc Score = 2   This indicates a 2.2% annual risk of stroke. The patient's score is based upon: CHF History: 1 HTN History: 0 Diabetes History: 0 Stroke History: 0 Vascular Disease History: 0 Age  Score: 1 Gender Score: 0             Physical Exam:   VS:  BP 122/74   Pulse 87   Ht 6' (1.829 m)   Wt 221 lb (100.2 kg)   SpO2 97%   BMI 29.97 kg/m    Wt Readings from Last 3 Encounters:  03/29/23 221 lb (100.2 kg)  03/22/23 218 lb (98.9 kg)  03/03/23 217 lb 9.6 oz (98.7 kg)     GEN: Well nourished, well developed in no acute distress NECK: No JVD; No carotid bruits CARDIAC: Irregularly irregular rate and rhythm, no murmurs, rubs, gallops RESPIRATORY:  Clear to auscultation without rales, wheezing or rhonchi  ABDOMEN: Soft, non-tender, non-distended EXTREMITIES:  No edema; No deformity   ASSESSMENT AND PLAN:   Joseph Henderson is a 72 y.o. male with newly discovered atrial fibrillation approximately 4 months ago.  He underwent cardioversion to sinus rhythm which reportedly lasted a little over 2 weeks.  While in atrial fibrillation, an echocardiogram was performed which showed a reduced LVEF of 30 to 35%.  Ischemic evaluation was negative.  I suspect he has an arrhythmia/tachycardia induced cardiomyopathy, although further workup with cardiac MRI is pending.   #. Persistent atrial fibrillation: I suspect he has had atrial fibrillation for quite some time given that his he has chamber enlargement on his echo.  He was recently cardioverted to sinus rhythm and reports improvement in his symptoms.  Given that he has an LVEF of 30 to 35%, which I suspect is secondary to an arrhythmia induced cardiomyopathy, I think it would be best to pursue a rhythm control strategy.  He has been started on amiodarone, but I do not like this is a long-term option given its side effect profile.  We discussed options for dofetilide versus ablation. Discussed risks, recovery and likelihood of success with each treatment strategy. Risk, benefits, and alternatives to EP study and ablation for afib were discussed. These risks include but are not limited to stroke, bleeding, vascular damage, tamponade, perforation,  damage to the esophagus, lungs, phrenic nerve and other structures, pulmonary vein stenosis, worsening renal function, coronary vasospasm and death.  Discussed potential need for repeat ablation procedures and antiarrhythmic drugs after an initial ablation. The patient understands these risk and wishes to proceed.   -We will therefore proceed with catheter ablation at the next available time.  Carto, ICE, anesthesia are requested for the procedure.  Will also obtain CT PV protocol prior to the procedure to exclude LAA thrombus and further evaluate atrial anatomy. -He is currently awaiting cardiac MRI for evaluation of the etiology of his heart failure.  We will typically schedule ablation far enough out for this to have resulted.  I will touch base with my imaging colleagues about timing. -He will continue on amiodarone until the time of ablation. -We will pursue another cardioversion in the next week now that he is on amiodarone.  Hopefully, with amiodarone on board he will be more likely to maintain sinus rhythm.  I have asked him to increase his amiodarone to 400 mg twice daily until the day of cardioversion.  He will resume his normal schedule after. -Continue Eliquis.  He denies any missed doses.  #.  Hypercoagulable state secondary to atrial fibrillation: -He has a CHA2DS2-VASc score of 2. -Continue Eliquis.  He denies any missed doses.  #. Chronic systolic heart failure: Likely secondary to tachycardia/arrhythmia-induced cardiomyopathy. -Cardiac MRI is pending. -Continue GDMT and close follow-up with Dr. Gala Romney.   Signed, Nobie Putnam, MD

## 2023-03-29 ENCOUNTER — Ambulatory Visit: Payer: Medicare HMO | Attending: Cardiology | Admitting: Cardiology

## 2023-03-29 ENCOUNTER — Encounter: Payer: Self-pay | Admitting: Cardiology

## 2023-03-29 VITALS — BP 122/74 | HR 87 | Ht 72.0 in | Wt 221.0 lb

## 2023-03-29 DIAGNOSIS — I5022 Chronic systolic (congestive) heart failure: Secondary | ICD-10-CM

## 2023-03-29 DIAGNOSIS — I4819 Other persistent atrial fibrillation: Secondary | ICD-10-CM

## 2023-03-29 DIAGNOSIS — D6869 Other thrombophilia: Secondary | ICD-10-CM

## 2023-03-29 NOTE — Patient Instructions (Signed)
Medication Instructions:  Your physician has recommended you make the following change in your medication:  1) INCREASE amiodarone to 400 mg twice daily until your cardioversion on 10/11, then go back to 200 mg twice daily *If you need a refill on your cardiac medications before your next appointment, please call your pharmacy*  Lab Work: TODAY: BMET and CBC  Testing/Procedures: Your physician has recommended that you have a Cardioversion (DCCV). Electrical Cardioversion uses a jolt of electricity to your heart either through paddles or wired patches attached to your chest. This is a controlled, usually prescheduled, procedure. Defibrillation is done under light anesthesia in the hospital, and you usually go home the day of the procedure. This is done to get your heart back into a normal rhythm. You are not awake for the procedure. Please see the instruction sheet given to you today.  Your physician has requested that you have cardiac CT. Cardiac computed tomography (CT) is a painless test that uses an x-ray machine to take clear, detailed pictures of your heart. For further information please visit [email or web address] Please follow instruction sheet as given.  Your physician has recommended that you have an ablation. Catheter ablation is a medical procedure used to treat some cardiac arrhythmias (irregular heartbeats). During catheter ablation, a long, thin, flexible tube is put into a blood vessel in your groin (upper thigh), or neck. This tube is called an ablation catheter. It is then guided to your heart through the blood vessel. Radio frequency waves destroy small areas of heart tissue where abnormal heartbeats may cause an arrhythmia to start. Please see the instruction sheet given to you today.  Follow-Up: At [facility], you and your health needs are our priority.  As part of our continuing mission to provide you with exceptional heart care, we have created designated Provider  Care Teams.  These Care Teams include your primary Cardiologist (physician) and Advanced Practice Providers (APPs -  Physician Assistants and Nurse Practitioners) who all work together to provide you with the care you need, when you need it.  Your next appointment:   See instruction letter

## 2023-03-29 NOTE — Addendum Note (Signed)
Addended by: Frutoso Schatz on: 03/29/2023 02:57 PM   Modules accepted: Orders

## 2023-03-30 LAB — BASIC METABOLIC PANEL
BUN/Creatinine Ratio: 14 (ref 10–24)
BUN: 18 mg/dL (ref 8–27)
CO2: 22 mmol/L (ref 20–29)
Calcium: 9.7 mg/dL (ref 8.6–10.2)
Chloride: 102 mmol/L (ref 96–106)
Creatinine, Ser: 1.29 mg/dL — ABNORMAL HIGH (ref 0.76–1.27)
Glucose: 95 mg/dL (ref 70–99)
Potassium: 4.4 mmol/L (ref 3.5–5.2)
Sodium: 140 mmol/L (ref 134–144)
eGFR: 59 mL/min/{1.73_m2} — ABNORMAL LOW (ref >59)

## 2023-03-30 LAB — CBC
Hematocrit: 42.8 % (ref 37.5–51.0)
Hemoglobin: 14.1 g/dL (ref 13.0–17.7)
MCH: 32.3 pg (ref 26.6–33.0)
MCHC: 32.9 g/dL (ref 31.5–35.7)
MCV: 98 fL — ABNORMAL HIGH (ref 79–97)
Platelets: 250 10*3/uL (ref 150–450)
RBC: 4.36 x10E6/uL (ref 4.14–5.80)
RDW: 13.1 % (ref 11.6–15.4)
WBC: 7.1 10*3/uL (ref 3.4–10.8)

## 2023-03-31 ENCOUNTER — Telehealth: Payer: Self-pay | Admitting: *Deleted

## 2023-04-01 NOTE — OR Nursing (Addendum)
Called patient with pre-procedure instructions for tomorrow.   Patient informed of:   Time to arrive for procedure.0700 Remain NPO past midnight.  Must have a ride home and a responsible adult to remain with them for 24 ours post procedure.  Confirmed blood thinner. Confirmed no breaks in taking blood thinner for 3+ weeks prior to procedure.

## 2023-04-01 NOTE — Anesthesia Preprocedure Evaluation (Signed)
Anesthesia Evaluation    Reviewed: Allergy & Precautions, Patient's Chart, lab work & pertinent test results  History of Anesthesia Complications Negative for: history of anesthetic complications  Airway        Dental   Pulmonary neg pulmonary ROS          Cardiovascular hypertension, Pt. on medications + Peripheral Vascular Disease  + dysrhythmias (on amiodarone and Eliquis) Atrial Fibrillation   TTE 12/2022: EF 30-35%, global hypokinesis, severe RVE, mild pHTN, severe LAE/RAE, mild to moderate MR, mild to moderate TR, mild AR      Neuro/Psych negative neurological ROS     GI/Hepatic Neg liver ROS,GERD  Medicated,,  Endo/Other  negative endocrine ROS    Renal/GU negative Renal ROS     Musculoskeletal negative musculoskeletal ROS (+)    Abdominal   Peds  Hematology negative hematology ROS (+)   Anesthesia Other Findings Day of surgery medications reviewed with patient.  Reproductive/Obstetrics                             Anesthesia Physical Anesthesia Plan  ASA: 4  Anesthesia Plan: General   Post-op Pain Management: Minimal or no pain anticipated   Induction: Intravenous  PONV Risk Score and Plan: 2 and Treatment may vary due to age or medical condition and Propofol infusion  Airway Management Planned: Mask  Additional Equipment: None  Intra-op Plan:   Post-operative Plan:   Informed Consent:   Plan Discussed with:   Anesthesia Plan Comments:        Anesthesia Quick Evaluation

## 2023-04-02 ENCOUNTER — Encounter (HOSPITAL_COMMUNITY): Payer: Self-pay | Admitting: Anesthesiology

## 2023-04-02 ENCOUNTER — Encounter (HOSPITAL_COMMUNITY)
Admission: RE | Disposition: A | Discharge status: Home / Self Care | Payer: Self-pay | Source: Home / Self Care | Attending: Internal Medicine

## 2023-04-02 ENCOUNTER — Ambulatory Visit (HOSPITAL_COMMUNITY)
Admission: RE | Admit: 2023-04-02 | Discharge: 2023-04-02 | Disposition: A | Discharge status: Home / Self Care | Payer: Medicare HMO | Attending: Internal Medicine | Admitting: Internal Medicine

## 2023-04-02 DIAGNOSIS — I4891 Unspecified atrial fibrillation: Secondary | ICD-10-CM | POA: Insufficient documentation

## 2023-04-02 DIAGNOSIS — Z01818 Encounter for other preprocedural examination: Secondary | ICD-10-CM

## 2023-04-02 DIAGNOSIS — Z539 Procedure and treatment not carried out, unspecified reason: Secondary | ICD-10-CM | POA: Insufficient documentation

## 2023-04-02 SURGERY — INVASIVE LAB ABORTED CASE
Anesthesia: Monitor Anesthesia Care

## 2023-04-02 SURGICAL SUPPLY — 1 items: PAD DEFIB RADIO PHYSIO CONN (PAD) ×1 IMPLANT

## 2023-04-02 NOTE — Plan of Care (Signed)
Mr. Portal was in sinus rhythm, cardioversion was aborted.

## 2023-04-02 NOTE — H&P (Signed)
aborted

## 2023-04-02 NOTE — Progress Notes (Signed)
Pt in SR, 12 lead EKG completed, Dr. Wyline Mood aware, pt may dc home

## 2023-04-04 ENCOUNTER — Encounter (HOSPITAL_COMMUNITY): Payer: Self-pay | Admitting: Internal Medicine

## 2023-04-06 ENCOUNTER — Encounter (HOSPITAL_COMMUNITY): Payer: Medicare HMO | Admitting: Internal Medicine

## 2023-04-06 ENCOUNTER — Encounter (INDEPENDENT_AMBULATORY_CARE_PROVIDER_SITE_OTHER): Payer: Medicare HMO | Admitting: Cardiology

## 2023-04-06 DIAGNOSIS — G4733 Obstructive sleep apnea (adult) (pediatric): Secondary | ICD-10-CM

## 2023-04-08 ENCOUNTER — Other Ambulatory Visit (HOSPITAL_COMMUNITY): Payer: Self-pay

## 2023-04-16 NOTE — Progress Notes (Incomplete)
***In Progress***    Advanced Heart Failure Clinic Note  Referring Physician: Geoffry Paradise, MD Primary Care: Geoffry Paradise, MD Primary Cardiologist: Dr. Allyson Sabal (formerly Garnette Scheuermann. MD) HF Cardiologist: Dr. Gala Romney  HPI:  Joseph Henderson is a 72 y.o.male with h/o atrial fibrillation and cardiomyopathy.   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 CAC = 0 in 01/2022. He was found to be in A-fib by an insurance nurse in 11/2022. He was referred to Cardiology. He was noted to be in AF with RVR. Started on metoprolol and Eliquis. Referred to EP and echo ordered.   Echo 01/11/23 showed EF 30-35% RV moderately enlarged/moderate HK with severe biatrial enlargement.    Seen in AF Clinic on 02/09/23 and was still in AF with RVR at rate 117.    He underwent successful DC cardioversion by Dr. Mayford Knife on 02/10/23    Was seen back in AF clinic on 03/03/23 and was back in AF (based on AppleWatch AF returned on 03/02/23). Amiodarone started on 03/15/23 and referred for ablation.    03/21/22 with [place] Here with his wife. Feels ok. Denies CP or SOB. Works out with a Psychologist, educational.   Today he returns to HF clinic for pharmacist medication titration. At last visit with MD, metoprolol tartrate 25 mg was stopped, metoprolol succinate 50 mg daily was started and losartan 25 mg daily was started.   Overall feeling ***. Dizziness, lightheadedness, fatigue:  Chest pain or palpitations:  How is your breathing?: *** SOB: Able to complete all ADLs. Activity level ***  Weight at home pounds. Takes furosemide/torsemide/bumex *** mg *** daily.  LEE PND/Orthopnea  Appetite *** Low-salt diet:   Physical Exam Cost/affordability of meds   HF Medications: Metoprolol succinate 50 mg daily Losartan 25 mg daily  Has the patient been experiencing any side effects to the medications prescribed?  {YES NO:22349}  Does the patient have any problems obtaining  medications due to transportation or finances?   {YES NO:22349}  Understanding of regimen: {excellent/good/fair/poor:19665} Understanding of indications: {excellent/good/fair/poor:19665} Potential of compliance: {excellent/good/fair/poor:19665} Patient understands to avoid NSAIDs. Patient understands to avoid decongestants.    Pertinent Lab Values: 03/29/23 Serum creatinine 1.29, BUN 18, Potassium 4.4, Sodium 140  Vital Signs: Weight: *** (last clinic weight: 218 lbs) Blood pressure: ***  Heart rate: ***   Assessment/Plan: 1. Chronic systolic HF  - Echo 01/11/23 EF 30-35% RV moderately enlarged/HK. Severe biatrial enlargement - Suspect tachy-mediated CM in setting of AF but with significant RV enlargement and severe biartrial enlargement also raises issue of underlying restrictive CM though LV does not appear thick. No obvious evidence of  shunting - No clear RFs for iCM and CAC -0 in 09/2021 so iCM unlikely - Will start with cMRI to reassess EF and look for structural abnormalities (unclear why LA so big) . NYHA II - Volume status ok  - Stop metoprolol tartrate. Switch to Toprol 50 - start losartan 25 - Refer to PharmD Clinic for GDMT titration   2. Persistent AF - discovered incidentally in 5/24 - s/p DC-CV on 02/10/23 with recurrence of AF 03/03/23 - Followed by AF Clinic. Amio started 03/15/23 - Remains in AF currently.  - On Eliquis 5 bid - Pending EP evaluation for ablation - HR still fast.  - Increase b-blocker as above - Check sleep study       Follow up with pharmacist at HF clinic in ***   Karle Plumber, PharmD, BCPS, BCCP,  CPP Heart Failure Clinic Pharmacist [phone]

## 2023-04-18 ENCOUNTER — Ambulatory Visit: Payer: Medicare HMO | Attending: Internal Medicine

## 2023-04-18 DIAGNOSIS — I519 Heart disease, unspecified: Secondary | ICD-10-CM

## 2023-04-18 DIAGNOSIS — G4719 Other hypersomnia: Secondary | ICD-10-CM

## 2023-04-18 NOTE — Procedures (Signed)
   SLEEP STUDY REPORT Patient Information Study Date: 04/06/2023 Patient Name: Joseph Henderson Patient ID: [id number] Birth Date: [birth year] Age: 72 Gender: Male BMI: 31.2 (W=218 lb, H=5' 10'') Stopbang: 4 Referring Physician: Nicholes Mango, MD  TEST DESCRIPTION: Home sleep apnea testing was completed using the WatchPat, a Type 1 device, utilizing peripheral arterial tonometry (PAT), chest movement, actigraphy, pulse oximetry, pulse rate, body position and snore. AHI was calculated with apnea and hypopnea using valid sleep time as the denominator. RDI includes apneas, hypopneas, and RERAs. The data acquired and the scoring of sleep and all associated events were performed in accordance with the recommended standards and specifications as outlined in the AASM Manual for the Scoring of Sleep and Associated Events 2.2.0 (2015).  FINDINGS:  1. Mild Obstructive Sleep Apnea with AHI 8.2/hr.  2. No Central Sleep Apnea with pAHIc 0.9/hr.  3. Oxygen desaturations as low as 87%.  4. Moderate snoring was present. O2 sats were < 88% for 0.4 min.  5. Total sleep time was 5 hrs and 29 min.  6. 12.7% of total sleep time was spent in REM sleep.  7. sleep onset latency at 23 min.  8. REM sleep onset latency at 55 min.  9. Total awakenings were 17. 10. Arrhythmia detection: None  DIAGNOSIS: Mild Obstructive Sleep Apnea (G47.33)  RECOMMENDATIONS: 1. Clinical correlation of these findings is necessary. The decision to treat obstructive sleep apnea (OSA) is usually based on the presence of apnea symptoms or the presence of associated medical conditions such as Hypertension, Congestive Heart Failure, Atrial Fibrillation or Obesity. The most common symptoms of OSA are snoring, gasping for breath while sleeping, daytime sleepiness and fatigue. 2. Initiating apnea therapy is recommended given the presence of symptoms and/or associated conditions. Recommend proceeding with one of the following:  a.  Auto-CPAP therapy with a pressure range of 5-20cm H2O.  b. An oral appliance (OA) that can be obtained from certain dentists with expertise in sleep medicine. These are primarily of use in non-obese patients with mild and moderate disease.  c. An ENT consultation which may be useful to look for specific causes of obstruction and possible treatment options.  d. If patient is intolerant to PAP therapy, consider referral to ENT for evaluation for hypoglossal nerve stimulator. 3. Close follow-up is necessary to ensure success with CPAP or oral appliance therapy for maximum benefit . 4. A follow-up oximetry study on CPAP is recommended to assess the adequacy of therapy and determine the need for supplemental oxygen or the potential need for Bi-level therapy. An arterial blood gas to determine the adequacy of baseline ventilation and oxygenation should also be considered. 5. Healthy sleep recommendations include: adequate nightly sleep (normal 7-9 hrs/night), avoidance of caffeine after noon and alcohol near bedtime, and maintaining a sleep environment that is cool, dark and quiet. 6. Weight loss for overweight patients is recommended. Even modest amounts of weight loss can significantly improve the severity of sleep apnea. 7. Snoring recommendations include: weight loss where appropriate, side sleeping, and avoidance of alcohol before bed. 8. Operation of motor vehicle should be avoided when sleepy.  Signature: [name], MD; [facility]; Diplomat, American Board of Sleep Medicine Electronically Signed: 04/18/2023 8:50:49 PM

## 2023-04-19 ENCOUNTER — Ambulatory Visit: Payer: Medicare HMO

## 2023-04-19 ENCOUNTER — Ambulatory Visit
Admission: RE | Admit: 2023-04-19 | Discharge: 2023-04-19 | Disposition: A | Discharge status: Home / Self Care | Payer: Medicare HMO | Source: Ambulatory Visit | Attending: Internal Medicine | Admitting: Internal Medicine

## 2023-04-19 VITALS — BP 114/86 | HR 53 | Wt 219.0 lb

## 2023-04-19 DIAGNOSIS — D6869 Other thrombophilia: Secondary | ICD-10-CM

## 2023-04-19 DIAGNOSIS — I5022 Chronic systolic (congestive) heart failure: Secondary | ICD-10-CM | POA: Diagnosis not present

## 2023-04-19 DIAGNOSIS — Z79899 Other long term (current) drug therapy: Secondary | ICD-10-CM | POA: Diagnosis not present

## 2023-04-19 DIAGNOSIS — Z7901 Long term (current) use of anticoagulants: Secondary | ICD-10-CM | POA: Insufficient documentation

## 2023-04-19 DIAGNOSIS — I4819 Other persistent atrial fibrillation: Secondary | ICD-10-CM | POA: Diagnosis not present

## 2023-04-19 DIAGNOSIS — I429 Cardiomyopathy, unspecified: Secondary | ICD-10-CM | POA: Diagnosis not present

## 2023-04-19 DIAGNOSIS — I4891 Unspecified atrial fibrillation: Secondary | ICD-10-CM | POA: Diagnosis not present

## 2023-04-19 MED ORDER — FARXIGA 10 MG PO TABS: 10.0000 mg | ORAL_TABLET | Freq: Every day | ORAL | 11 refills | Status: DC

## 2023-04-19 NOTE — Patient Instructions (Signed)
It was a pleasure seeing you today!  MEDICATIONS: -We are changing your medications today -Start Farxiga 10 mg (1 tablet) daily. Please HOLD Farxiga 72 hours prior to your ablation procedure. -Call if you have questions about your medications.  -Over the new few weeks we will be trying to optimize your heart failure medications. We would like to add a medication called spironolactone to your medications. We would also ideally like to change losartan to [facility]. Both Marcelline Deist and Sherryll Burger are brand name medications. I would recommend incorporating this "ideal" regimen into any plans for choosing a new insurance plan.    NEXT APPOINTMENT: Return to clinic in 3 weeks with Pharmacy Clinic.  In general, to take care of your heart failure: -Limit your fluid intake to 2 Liters (half-gallon) per day.   -Limit your salt intake to ideally 2-3 grams (2000-3000 mg) per day. -Weigh yourself daily and record, and bring that "weight diary" to your next appointment.  (Weight gain of 2-3 pounds in 1 day typically means fluid weight.) -The medications for your heart are to help your heart and help you live longer.   -Please contact us before stopping any of your heart medications.  Call the clinic at [phone] with questions or to reschedule future appointments.

## 2023-04-19 NOTE — Progress Notes (Signed)
Advanced Heart Failure Clinic Note   Referring Physician: Geoffry Paradise, MD Primary Care: Geoffry Paradise, MD Primary Cardiologist: Dr. Allyson Sabal (formerly Garnette Scheuermann. MD) HF Cardiologist: Dr. Gala Romney  HPI:  Joseph Henderson is a 72 y.o.male with h/o atrial fibrillation and cardiomyopathy.   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 CAC = 0 in 01/2022. He was found to be in A-fib by an insurance nurse in 11/2022. He was referred to Cardiology. He was noted to be in AF with RVR. Started on metoprolol and Eliquis. Referred to EP and echo ordered.   Echo 01/11/23 showed EF 30-35% RV moderately enlarged/moderate HK with severe biatrial enlargement.    Seen in AF Clinic on 02/09/23 and was still in AF with RVR at rate 117.    He underwent successful DC cardioversion by Dr. Mayford Knife on 02/10/23    Was seen back in AF clinic on 03/03/23 and was back in AF (based on AppleWatch AF returned on 03/02/23). Amiodarone started on 03/15/23 and referred for ablation.   Recently seen on by Dr. Gala Romney on 03/21/22. Came with his wife. Felt ok. Denied CP or SOB. Working out with a Psychologist, educational.   DCCV scheduled for 04/02/23 aborted as patient was in NSR.  Today he returns to HF clinic for pharmacist medication titration. At last visit with MD, metoprolol tartrate 25 mg was stopped, metoprolol succinate 50 mg daily was started and losartan 25 mg daily was started. Overall feeling fine today. No dizziness or lightheadedness. No fatigue. No CP/palpitations. No SOB/DOE. Does not monitor weight at home. In clinic, weight is stable around 218-219 lbs. Not on diuretics. No LEE, PND or orthopnea. Appetite normal. Does not monitor BP at home. In clinic, BP 114/86 mmHg. Taking all medications as prescribed and tolerating all medications. Ablation scheduled for 05/05/23.   HF Medications: Metoprolol succinate 50 mg daily Losartan 25 mg daily  Has the patient been experiencing  any side effects to the medications prescribed? No  Does the patient have any problems obtaining medications due to transportation or finances? No - [employer]. Income > limit for [employer] grant.   Understanding of regimen: good Understanding of indications: good Potential of compliance: good Patient understands to avoid NSAIDs. Patient understands to avoid decongestants.    Pertinent Lab Values: 03/29/23 Serum creatinine 1.29, BUN 18, Potassium 4.4, Sodium 140 BMET, CBC today pending (drawn at [facility]. Office)  Vital Signs: Weight: 219 lbs (last clinic weight: 218 lbs) Blood pressure: 114/86 mmHg  Heart rate: 53 bpm   Assessment/Plan: 1. Chronic systolic HF  - Echo 01/11/23 EF 30-35% RV moderately enlarged/HK. Severe biatrial enlargement - Suspect tachy-mediated CM in setting of AF but with significant RV enlargement and severe biartrial enlargement also raises issue of underlying restrictive CM though LV does not appear thick. No obvious evidence of  shunting - No clear RFs for iCM and CAC -0 in 09/2021 so iCM unlikely - cMRI to reassess EF and look for structural abnormalities (unclear why LA so big) scheduled  for 04/21/23. - CT and ECHO scheduled for next week - NYHA II; euvolemic on exam today.  - Does not need a loop diuretic - Continue metoprolol succinate 50 mg daily - Continue losartan 25 mg daily - Start Farxiga 10 mg daily    2. Persistent AF - discovered incidentally in 10/2022 - s/p DC-CV on 02/10/23 with recurrence of AF 03/03/23. DCCV scheduled 04/02/23 was aborted as patient  was in NSR - Followed by AF Clinic. Continue amiodarone 200 mg daily - Continue Eliquis 5 mg BID - Ablation scheduled for 05/05/23; hold Farxiga 72 hours prior to ablation procedure - HR 53 today - Referred for sleep study     Follow up with pharmacist at HF clinic in 3 weeks (after ablation). Will try to either start spironolactone or change losartan to Entresto.    Karle Plumber,  PharmD, BCPS, BCCP, CPP Heart Failure Clinic Pharmacist [phone]

## 2023-04-20 ENCOUNTER — Telehealth (HOSPITAL_COMMUNITY): Payer: Self-pay | Admitting: *Deleted

## 2023-04-20 ENCOUNTER — Ambulatory Visit (HOSPITAL_BASED_OUTPATIENT_CLINIC_OR_DEPARTMENT_OTHER): Payer: Medicare HMO

## 2023-04-20 DIAGNOSIS — I4819 Other persistent atrial fibrillation: Secondary | ICD-10-CM | POA: Diagnosis not present

## 2023-04-20 DIAGNOSIS — R9430 Abnormal result of cardiovascular function study, unspecified: Secondary | ICD-10-CM

## 2023-04-20 DIAGNOSIS — I517 Cardiomegaly: Secondary | ICD-10-CM | POA: Diagnosis not present

## 2023-04-20 DIAGNOSIS — I083 Combined rheumatic disorders of mitral, aortic and tricuspid valves: Secondary | ICD-10-CM

## 2023-04-20 LAB — ECHOCARDIOGRAM COMPLETE
Area-P 1/2: 1.48 cm2
P 1/2 time: 687 ms
S' Lateral: 4.35 cm

## 2023-04-20 LAB — CBC
Hematocrit: 43.8 % (ref 37.5–51.0)
Hemoglobin: 15 g/dL (ref 13.0–17.7)
MCH: 33 pg (ref 26.6–33.0)
MCHC: 34.2 g/dL (ref 31.5–35.7)
MCV: 97 fL (ref 79–97)
Platelets: 206 10*3/uL (ref 150–450)
RBC: 4.54 x10E6/uL (ref 4.14–5.80)
RDW: 12.6 % (ref 11.6–15.4)
WBC: 5.6 10*3/uL (ref 3.4–10.8)

## 2023-04-20 LAB — BASIC METABOLIC PANEL
BUN/Creatinine Ratio: 15 (ref 10–24)
BUN: 18 mg/dL (ref 8–27)
CO2: 21 mmol/L (ref 20–29)
Calcium: 9.6 mg/dL (ref 8.6–10.2)
Chloride: 100 mmol/L (ref 96–106)
Creatinine, Ser: 1.18 mg/dL (ref 0.76–1.27)
Glucose: 91 mg/dL (ref 70–99)
Potassium: 4.7 mmol/L (ref 3.5–5.2)
Sodium: 139 mmol/L (ref 134–144)
eGFR: 66 mL/min/{1.73_m2} (ref >59)

## 2023-04-20 NOTE — Telephone Encounter (Signed)
Patient returning call about his upcoming cardiac imaging study; pt verbalizes understanding of appt date/time, parking situation and where to check in, pre-test NPO status and verified current allergies; name and call back number provided for further questions should they arise  Larey Brick RN Navigator Cardiac Imaging [facility] [phone] office [phone] cell  Patient aware to arrive at 9am.

## 2023-04-20 NOTE — Telephone Encounter (Signed)
Attempted to call patient regarding upcoming cardiac CT appointment. °Left message on voicemail with name and callback number ° °Edie Vallandingham RN Navigator Cardiac Imaging °[facility] °[phone] Office °[phone] Cell ° °

## 2023-04-21 ENCOUNTER — Ambulatory Visit (HOSPITAL_COMMUNITY)
Admission: RE | Admit: 2023-04-21 | Discharge: 2023-04-21 | Disposition: A | Discharge status: Home / Self Care | Payer: Medicare HMO | Source: Ambulatory Visit | Attending: Cardiology | Admitting: Cardiology

## 2023-04-21 DIAGNOSIS — I4819 Other persistent atrial fibrillation: Secondary | ICD-10-CM | POA: Diagnosis not present

## 2023-04-21 DIAGNOSIS — R9430 Abnormal result of cardiovascular function study, unspecified: Secondary | ICD-10-CM | POA: Diagnosis not present

## 2023-04-21 MED ORDER — IOHEXOL 350 MG/ML SOLN
100.0000 mL | Freq: Once | INTRAVENOUS | Status: AC | PRN
  Administered 2023-04-21: 100 mL via INTRAVENOUS

## 2023-04-23 ENCOUNTER — Encounter: Payer: Self-pay | Admitting: Physician Assistant

## 2023-04-27 ENCOUNTER — Encounter (HOSPITAL_COMMUNITY): Payer: Self-pay

## 2023-04-27 NOTE — Progress Notes (Cosign Needed Addendum)
Advanced Heart Failure Clinic Note   Referring Physician: Geoffry Paradise, MD Primary Care: Geoffry Paradise, MD Primary Cardiologist: Dr. Allyson Sabal (formerly Garnette Scheuermann. MD) HF Cardiologist: Dr. Gala Romney  Virtual Visit via Telephone Call  I connected with [name] on 05/10/23 at  9:00 AM EST by a phone call and verified that I am speaking with the correct person using two identifiers.  Location: Patient: home via telephone call Provider: office via telephone call   I discussed the limitations of evaluation and management by telemedicine and the availability of in person appointments. The patient expressed understanding and agreed to proceed.  HPI:  Akon Bertani is a 72 y.o.male with h/o atrial fibrillation and cardiomyopathy. He presents today for a telephone visit. He was referred to pharmacy by Dr. Gala Romney for assistance in managing heart failure GDMT titration.    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 CAC = 0 in 01/2022. He was found to be in A-fib by an insurance nurse in 11/2022. He was referred to Cardiology. He was noted to be in AF with RVR. Started on metoprolol and Eliquis. Referred to EP and echo ordered.   Echo 01/11/23 showed EF 30-35% RV moderately enlarged/moderate HK with severe biatrial enlargement.    Seen in AF Clinic on 02/09/23 and was still in AF with RVR at rate 117.    He underwent successful DC cardioversion by Dr. Mayford Knife on 02/10/23    Was seen back in AF clinic on 03/03/23 and was back in AF (based on AppleWatch AF returned on 03/02/23). Amiodarone started on 03/15/23 and referred for ablation.   Recently seen on by Dr. Gala Romney on 03/21/22. Came with his wife. Felt ok. Denied CP or SOB. Working out with a Psychologist, educational.   DCCV scheduled for 04/02/23 aborted as patient was in NSR.  Echo 03/2023 EF 45-50%, RV systolic function low normal. Left atrial size was severely dilated. Right atrial size was  moderately dilated. Mild mitral valve regurgitation.   Today he presents for a telephone visit with pharmacy clinic. At recent visits to clinic, metoprolol tartrate 25 mg was stopped, metoprolol succinate 50 mg daily was started and losartan 25 mg daily was started. Additionally, Farxiga 10 mg daily was initiated. Now s/p AF ablation 05/05/23, was instructed to decrease amiodarone to 200 mg daily and continue for 1 month, then plan to stop. CMR with LVEF 59%, normal size and wall motion; severe biatrial enlargement and moderate mitral regurgitation. Overall he is feeling well today. Says he is feeling much better now that he is out of AF. No dizziness or lightheadedness. Has been checking HR at home on his watch, usually 47-78 bpm, though can be up to 111. Has also been checking his BP at home, 105-118/62-72. No SOB/DOE. Has been walking 3 miles and has improved his time every time. Weight at home has been stable at 215 lbs. States he does not have any LEE. Does not need a loop diuretic.   HF Medications: Metoprolol succinate 50 mg daily Losartan 25 mg daily Farxiga 10 mg daily  Has the patient been experiencing any side effects to the medications prescribed? No  Does the patient have any problems obtaining medications due to transportation or finances? No - [employer]. Income > limit for [employer] grant.   Understanding of regimen: good Understanding of indications: good Potential of compliance: good Patient understands to avoid NSAIDs. Patient understands to avoid decongestants.  Pertinent Lab Values: 03/29/23 Serum creatinine 1.29, BUN 18, Potassium 4.4, Sodium 140 04/19/23 Serum creatinine 1.18, BUN 18, Potassium 4.7, Sodium 139  Assessment/Plan: 1. Chronic systolic HF  - Echo 01/11/23 EF 30-35% RV moderately enlarged/HK. Severe biatrial enlargement - Suspect tachy-mediated CM in setting of AF but with significant RV enlargement and severe biartrial enlargement also raises issue  of underlying restrictive CM though LV does not appear thick. No obvious evidence of  shunting - No clear RFs for iCM and CAC -0 in 09/2021 so iCM unlikely -Echo 03/2023 EF 45-50%, Left atrial size was severely dilated and right atrial size was moderately dilated. - cMRI to reassess EF and look for structural abnormalities. Completed 04/29/23, LVEF 59%, normal size and wall motion; severe biatrial enlargement and moderate mitral regurgitation. - NYHA II - Does not need a loop diuretic - Continue metoprolol succinate 50 mg daily - Continue losartan 25 mg daily - Continue Farxiga 10 mg daily  - Recommended starting spironolactone today. However, patient would prefer to wait on making any more medication changes until he sees Dr. Gala Romney on 06/07/23. He is hopeful some medications may be able to be discontinued with recovery in EF after his discussion with Dr. Jimmey Ralph. Shared that this is not commonly done for HF GDMT given documentation that EF can decline and HF symptoms can return after discontinuing GDMT (Tred-HF) but agree with waiting for further uptitration based off shared decision making.    2. Persistent AF - discovered incidentally in 10/2022 - s/p DC-CV on 02/10/23 with recurrence of AF 03/03/23. DCCV scheduled 04/02/23 was aborted as patient was in NSR - Followed by AF Clinic. Continue amiodarone 200 mg daily, plan to stop 1 month after ablation - Continue Eliquis 5 mg BID - Now s/p ablation 05/05/23 - Referred for sleep study     Follow up 1 month with Dr. Gala Romney.   Follow Up Instructions:    I discussed the assessment and treatment plan with the patient. The patient was provided an opportunity to ask questions and all were answered. The patient agreed with the plan and demonstrated an understanding of the instructions.   The patient was advised to call back or seek an in-person evaluation if the symptoms worsen or if the condition fails to improve as anticipated.    Karle Plumber, PharmD, BCPS, BCCP, CPP Heart Failure Clinic Pharmacist [phone]

## 2023-04-29 ENCOUNTER — Ambulatory Visit (HOSPITAL_COMMUNITY)
Admission: RE | Admit: 2023-04-29 | Discharge: 2023-04-29 | Disposition: A | Discharge status: Home / Self Care | Payer: Medicare HMO | Source: Ambulatory Visit | Attending: Internal Medicine | Admitting: Internal Medicine

## 2023-04-29 ENCOUNTER — Other Ambulatory Visit (HOSPITAL_COMMUNITY): Payer: Self-pay | Admitting: Internal Medicine

## 2023-04-29 DIAGNOSIS — I519 Heart disease, unspecified: Secondary | ICD-10-CM

## 2023-04-29 MED ORDER — GADOBUTROL 1 MMOL/ML IV SOLN
9.0000 mL | Freq: Once | INTRAVENOUS | Status: AC | PRN
  Administered 2023-04-29: 9 mL via INTRAVENOUS

## 2023-05-03 DIAGNOSIS — R69 Illness, unspecified: Secondary | ICD-10-CM | POA: Diagnosis not present

## 2023-05-04 NOTE — Anesthesia Preprocedure Evaluation (Signed)
Anesthesia Evaluation  Patient identified by MRN, date of birth, ID band Patient awake    Reviewed: Allergy & Precautions, NPO status , Patient's Chart, lab work & pertinent test results, reviewed documented beta blocker date and time   Airway Mallampati: I  TM Distance: >3 FB Neck ROM: Full    Dental no notable dental hx. (+) Teeth Intact, Dental Advisory Given   Pulmonary neg pulmonary ROS   Pulmonary exam normal breath sounds clear to auscultation       Cardiovascular + Peripheral Vascular Disease  Normal cardiovascular exam+ dysrhythmias (eliquis) Atrial Fibrillation  Rhythm:Regular Rate:Normal  TTE 2024 1. Left ventricular ejection fraction, by estimation, is 45 to 50%. Left  ventricular ejection fraction by 3D volume is 52 %. The left ventricle has  mildly decreased function. The left ventricle demonstrates global  hypokinesis. Left ventricular diastolic   parameters are indeterminate.   2. Right ventricular systolic function is low normal. The right  ventricular size is mildly enlarged. There is normal pulmonary artery  systolic pressure. The estimated right ventricular systolic pressure is  30.7 mmHg.   3. Left atrial size was severely dilated.   4. Right atrial size was moderately dilated.   5. The mitral valve is normal in structure. Mild mitral valve  regurgitation. No evidence of mitral stenosis.   6. The aortic valve is tricuspid. Aortic valve regurgitation is mild to  moderate. No aortic stenosis is present.   7. There is borderline dilatation of the ascending aorta, measuring 39  mm.   8. The inferior vena cava is dilated in size with >50% respiratory  variability, suggesting right atrial pressure of 8 mmHg.     Neuro/Psych negative neurological ROS  negative psych ROS   GI/Hepatic Neg liver ROS,GERD  ,,  Endo/Other  negative endocrine ROS    Renal/GU negative Renal ROS  negative genitourinary    Musculoskeletal negative musculoskeletal ROS (+)    Abdominal   Peds  Hematology negative hematology ROS (+)   Anesthesia Other Findings   Reproductive/Obstetrics                             Anesthesia Physical Anesthesia Plan  ASA: 3  Anesthesia Plan: General   Post-op Pain Management: Tylenol PO (pre-op)*   Induction: Intravenous  PONV Risk Score and Plan: 2 and Dexamethasone, Ondansetron and Treatment may vary due to age or medical condition  Airway Management Planned: Oral ETT  Additional Equipment:   Intra-op Plan:   Post-operative Plan: Extubation in OR  Informed Consent: I have reviewed the patients History and Physical, chart, labs and discussed the procedure including the risks, benefits and alternatives for the proposed anesthesia with the patient or authorized representative who has indicated his/her understanding and acceptance.     Dental advisory given  Plan Discussed with: CRNA  Anesthesia Plan Comments:        Anesthesia Quick Evaluation

## 2023-05-04 NOTE — Pre-Procedure Instructions (Signed)
Attempted to call patient regarding procedure instructions for tomorrow.  Left voicemail on the following items: Arrival time 0600 Nothing to eat or drink after midnight No meds AM of procedure Responsible person to drive you home and stay with you for 24 hrs  Have you missed any doses of anti-coagulant Eliquis- should be taken twice a day, if you have missed any doses please let us know.  Don't take dose in the morning.

## 2023-05-05 ENCOUNTER — Encounter (HOSPITAL_COMMUNITY): Payer: Self-pay | Admitting: Cardiology

## 2023-05-05 ENCOUNTER — Ambulatory Visit (HOSPITAL_BASED_OUTPATIENT_CLINIC_OR_DEPARTMENT_OTHER): Payer: Medicare HMO | Admitting: Anesthesiology

## 2023-05-05 ENCOUNTER — Ambulatory Visit (HOSPITAL_COMMUNITY)
Admission: RE | Disposition: A | Discharge status: Home / Self Care | Payer: Medicare HMO | Source: Home / Self Care | Attending: Cardiology

## 2023-05-05 ENCOUNTER — Ambulatory Visit (HOSPITAL_COMMUNITY): Payer: Medicare HMO | Admitting: Anesthesiology

## 2023-05-05 ENCOUNTER — Ambulatory Visit (HOSPITAL_COMMUNITY)
Admission: RE | Admit: 2023-05-05 | Discharge: 2023-05-05 | Disposition: A | Discharge status: Home / Self Care | Payer: Medicare HMO | Attending: Cardiology | Admitting: Cardiology

## 2023-05-05 DIAGNOSIS — I739 Peripheral vascular disease, unspecified: Secondary | ICD-10-CM | POA: Diagnosis not present

## 2023-05-05 DIAGNOSIS — D6859 Other primary thrombophilia: Secondary | ICD-10-CM | POA: Diagnosis not present

## 2023-05-05 DIAGNOSIS — I5022 Chronic systolic (congestive) heart failure: Secondary | ICD-10-CM | POA: Insufficient documentation

## 2023-05-05 DIAGNOSIS — Z7901 Long term (current) use of anticoagulants: Secondary | ICD-10-CM | POA: Diagnosis not present

## 2023-05-05 DIAGNOSIS — I4819 Other persistent atrial fibrillation: Secondary | ICD-10-CM | POA: Diagnosis not present

## 2023-05-05 HISTORY — PX: ATRIAL FIBRILLATION ABLATION: EP1191

## 2023-05-05 LAB — POCT ACTIVATED CLOTTING TIME
Activated Clotting Time: 268 s
Activated Clotting Time: 331 s

## 2023-05-05 SURGERY — ATRIAL FIBRILLATION ABLATION
Anesthesia: General

## 2023-05-05 MED ORDER — ATROPINE SULFATE 1 MG/10ML IJ SOSY
PREFILLED_SYRINGE | INTRAMUSCULAR | Status: AC
  Filled 2023-05-05: qty 10

## 2023-05-05 MED ORDER — DEXAMETHASONE SODIUM PHOSPHATE 10 MG/ML IJ SOLN
INTRAMUSCULAR | Status: DC | PRN
  Administered 2023-05-05: 10 mg via INTRAVENOUS

## 2023-05-05 MED ORDER — PROPOFOL 10 MG/ML IV BOLUS
INTRAVENOUS | Status: DC | PRN
  Administered 2023-05-05: 120 mg via INTRAVENOUS

## 2023-05-05 MED ORDER — SUGAMMADEX SODIUM 200 MG/2ML IV SOLN
INTRAVENOUS | Status: DC | PRN
  Administered 2023-05-05: 200 mg via INTRAVENOUS

## 2023-05-05 MED ORDER — ACETAMINOPHEN 500 MG PO TABS
1000.0000 mg | ORAL_TABLET | Freq: Once | ORAL | Status: AC
  Administered 2023-05-05: 1000 mg via ORAL
  Filled 2023-05-05: qty 2

## 2023-05-05 MED ORDER — ATROPINE SULFATE 1 MG/10ML IJ SOSY
PREFILLED_SYRINGE | INTRAMUSCULAR | Status: DC | PRN
  Administered 2023-05-05: 1 mg via INTRAVENOUS

## 2023-05-05 MED ORDER — SODIUM CHLORIDE 0.9% FLUSH: 3.0000 mL | INTRAVENOUS | Status: DC | PRN

## 2023-05-05 MED ORDER — ROCURONIUM BROMIDE 10 MG/ML (PF) SYRINGE
PREFILLED_SYRINGE | INTRAVENOUS | Status: DC | PRN
  Administered 2023-05-05: 20 mg via INTRAVENOUS
  Administered 2023-05-05: 50 mg via INTRAVENOUS
  Administered 2023-05-05: 30 mg via INTRAVENOUS

## 2023-05-05 MED ORDER — ONDANSETRON HCL 4 MG/2ML IJ SOLN
INTRAMUSCULAR | Status: DC | PRN
  Administered 2023-05-05: 4 mg via INTRAVENOUS

## 2023-05-05 MED ORDER — EPHEDRINE SULFATE-NACL 50-0.9 MG/10ML-% IV SOSY
PREFILLED_SYRINGE | INTRAVENOUS | Status: DC | PRN
  Administered 2023-05-05: 2.5 mg via INTRAVENOUS
  Administered 2023-05-05: 10 mg via INTRAVENOUS
  Administered 2023-05-05: 5 mg via INTRAVENOUS
  Administered 2023-05-05: 2.5 mg via INTRAVENOUS

## 2023-05-05 MED ORDER — ONDANSETRON HCL 4 MG/2ML IJ SOLN: 4.0000 mg | Freq: Four times a day (QID) | INTRAMUSCULAR | Status: DC | PRN

## 2023-05-05 MED ORDER — FENTANYL CITRATE (PF) 100 MCG/2ML IJ SOLN
25.0000 ug | INTRAMUSCULAR | Status: DC | PRN
Start: 2023-05-05 — End: 2023-05-05

## 2023-05-05 MED ORDER — SODIUM CHLORIDE 0.9 % IV SOLN: 250.0000 mL | INTRAVENOUS | Status: DC | PRN

## 2023-05-05 MED ORDER — ACETAMINOPHEN 325 MG PO TABS
650.0000 mg | ORAL_TABLET | ORAL | Status: DC | PRN
  Filled 2023-05-05: qty 2

## 2023-05-05 MED ORDER — HEPARIN (PORCINE) IN NACL 1000-0.9 UT/500ML-% IV SOLN
INTRAVENOUS | Status: DC | PRN
  Administered 2023-05-05 (×3): 500 mL

## 2023-05-05 MED ORDER — FENTANYL CITRATE (PF) 100 MCG/2ML IJ SOLN
INTRAMUSCULAR | Status: AC
  Filled 2023-05-05: qty 2

## 2023-05-05 MED ORDER — APIXABAN 5 MG PO TABS
5.0000 mg | ORAL_TABLET | Freq: Once | ORAL | Status: AC
  Administered 2023-05-05: 5 mg via ORAL
  Filled 2023-05-05: qty 1

## 2023-05-05 MED ORDER — HEPARIN SODIUM (PORCINE) 1000 UNIT/ML IJ SOLN
INTRAMUSCULAR | Status: DC | PRN
  Administered 2023-05-05: 1000 [IU] via INTRAVENOUS
  Administered 2023-05-05: 15000 [IU] via INTRAVENOUS
  Administered 2023-05-05: 1000 [IU] via INTRAVENOUS

## 2023-05-05 MED ORDER — PHENYLEPHRINE 80 MCG/ML (10ML) SYRINGE FOR IV PUSH (FOR BLOOD PRESSURE SUPPORT)
PREFILLED_SYRINGE | INTRAVENOUS | Status: DC | PRN
  Administered 2023-05-05: 80 ug via INTRAVENOUS

## 2023-05-05 MED ORDER — AMIODARONE HCL 200 MG PO TABS: 200.0000 mg | ORAL_TABLET | Freq: Every day | ORAL | Status: DC

## 2023-05-05 MED ORDER — VECURONIUM BROMIDE 10 MG IV SOLR
INTRAVENOUS | Status: DC | PRN
  Administered 2023-05-05: 3 mg via INTRAVENOUS

## 2023-05-05 MED ORDER — LIDOCAINE 2% (20 MG/ML) 5 ML SYRINGE
INTRAMUSCULAR | Status: DC | PRN
  Administered 2023-05-05: 60 mg via INTRAVENOUS

## 2023-05-05 MED ORDER — SODIUM CHLORIDE 0.9 % IV SOLN: INTRAVENOUS | Status: DC

## 2023-05-05 MED ORDER — SODIUM CHLORIDE 0.9% FLUSH: 3.0000 mL | Freq: Two times a day (BID) | INTRAVENOUS | Status: DC

## 2023-05-05 MED ORDER — FENTANYL CITRATE (PF) 250 MCG/5ML IJ SOLN
INTRAMUSCULAR | Status: DC | PRN
  Administered 2023-05-05 (×2): 50 ug via INTRAVENOUS

## 2023-05-05 MED ORDER — HEPARIN SODIUM (PORCINE) 1000 UNIT/ML IJ SOLN
INTRAMUSCULAR | Status: AC
  Filled 2023-05-05: qty 30

## 2023-05-05 MED ORDER — PROTAMINE SULFATE 10 MG/ML IV SOLN
INTRAVENOUS | Status: DC | PRN
  Administered 2023-05-05: 35 mg via INTRAVENOUS

## 2023-05-05 MED ORDER — PHENYLEPHRINE HCL-NACL 20-0.9 MG/250ML-% IV SOLN
INTRAVENOUS | Status: DC | PRN
  Administered 2023-05-05: 20 ug/min via INTRAVENOUS

## 2023-05-05 SURGICAL SUPPLY — 22 items
BAG SNAP BAND KOVER 36X36 (MISCELLANEOUS) IMPLANT
CABLE PFA RX CATH CONN (CABLE) IMPLANT
CATH 8FR REPROCESSED SOUNDSTAR (CATHETERS) ×1 IMPLANT
CATH 8FR SOUNDSTAR REPROCESSED (CATHETERS) IMPLANT
CATH FARAWAVE ABLATION 31 (CATHETERS) IMPLANT
CATH OCTARAY 2.0 F 3-3-3-3-3 (CATHETERS) IMPLANT
CATH SOUNDSTAR ECO 8FR (CATHETERS) IMPLANT
CATH WEBSTER BI DIR CS D-F CRV (CATHETERS) IMPLANT
CLOSURE PERCLOSE PROSTYLE (VASCULAR PRODUCTS) IMPLANT
COVER SWIFTLINK CONNECTOR (BAG) ×1 IMPLANT
DEVICE CLOSURE MYNXGRIP 6/7F (Vascular Products) IMPLANT
DILATOR VESSEL 38 20CM 16FR (INTRODUCER) IMPLANT
GUIDEWIRE INQWIRE 1.5J.035X260 (WIRE) IMPLANT
INQWIRE 1.5J .035X260CM (WIRE) ×1
PACK EP LF (CUSTOM PROCEDURE TRAY) ×1 IMPLANT
PAD DEFIB RADIO PHYSIO CONN (PAD) ×1 IMPLANT
PATCH CARTO3 (PAD) IMPLANT
SHEATH FARADRIVE STEERABLE (SHEATH) IMPLANT
SHEATH PINNACLE 8F 10CM (SHEATH) IMPLANT
SHEATH PINNACLE 9F 10CM (SHEATH) IMPLANT
SHEATH PROBE COVER 6X72 (BAG) IMPLANT
SHEATH WIRE KIT BAYLIS SL1 (KITS) IMPLANT

## 2023-05-05 NOTE — Transfer of Care (Signed)
Immediate Anesthesia Transfer of Care Note  Patient: Joseph Henderson  Procedure(s) Performed: ATRIAL FIBRILLATION ABLATION  Patient Location: PACU and Cath Lab  Anesthesia Type:General  Level of Consciousness: awake, alert , oriented, and patient cooperative  Airway & Oxygen Therapy: Patient Spontanous Breathing  Post-op Assessment: Report given to RN, Post -op Vital signs reviewed and stable, and Patient moving all extremities  Post vital signs: Reviewed and stable  Last Vitals:  Vitals Value Taken Time  BP    Temp    Pulse    Resp    SpO2      Last Pain:  Vitals:   05/05/23 0730  TempSrc:   PainSc: 0-No pain         Complications: No notable events documented.

## 2023-05-05 NOTE — Anesthesia Postprocedure Evaluation (Signed)
Anesthesia Post Note  Patient: Joseph Henderson  Procedure(s) Performed: ATRIAL FIBRILLATION ABLATION     Patient location during evaluation: PACU Anesthesia Type: General Level of consciousness: awake and alert Pain management: pain level controlled Vital Signs Assessment: post-procedure vital signs reviewed and stable Respiratory status: spontaneous breathing, nonlabored ventilation, respiratory function stable and patient connected to nasal cannula oxygen Cardiovascular status: blood pressure returned to baseline and stable Postop Assessment: no apparent nausea or vomiting Anesthetic complications: no  No notable events documented.  Last Vitals:  Vitals:   05/05/23 1215 05/05/23 1300  BP: 105/76 118/70  Pulse: (!) 59 63  Resp: 19 14  Temp:    SpO2: 97% 96%    Last Pain:  Vitals:   05/05/23 1142  TempSrc:   PainSc: 0-No pain                 [name]

## 2023-05-05 NOTE — Discharge Instructions (Addendum)

## 2023-05-05 NOTE — Anesthesia Procedure Notes (Addendum)
Procedure Name: Intubation Date/Time: 05/05/2023 8:55 AM  Performed by: Ammie Dalton, CRNAPre-anesthesia Checklist: Patient identified, Emergency Drugs available, Suction available and Patient being monitored Patient Re-evaluated:Patient Re-evaluated prior to induction Oxygen Delivery Method: Circle System Utilized Preoxygenation: Pre-oxygenation with 100% oxygen Induction Type: IV induction Ventilation: Mask ventilation without difficulty Laryngoscope Size: Mac and 4 Grade View: Grade I Tube type: Oral Tube size: 7.5 mm Number of attempts: 1 Airway Equipment and Method: Stylet and Oral airway Placement Confirmation: ETT inserted through vocal cords under direct vision, positive ETCO2 and breath sounds checked- equal and bilateral Secured at: 22 cm Tube secured with: Tape Dental Injury: Teeth and Oropharynx as per pre-operative assessment

## 2023-05-05 NOTE — H&P (Signed)
Electrophysiology Note:   Date:  05/05/2023  ID:  Joseph Henderson, DOB September 25, 1950, MRN [id number]   Primary Cardiologist: None Electrophysiologist: Nobie Putnam, MD       History of Present Illness:   Joseph Henderson is a 72 y.o. male with h/o persistent atrial fibrillation and chronic systolic heart failure secondary to likely tachycardia/arrhythmia induced cardiomyopathy seen today for Electrophysiology evaluation of atrial fibrillation at the request of Dr. Gala Romney.    Patient reportedly had new diagnosis of atrial fibrillation in June of this year after a home health nurse discovered that his heart rate was irregular with auscultation.  Echocardiogram was performed which showed a newly discovered LVEF of 30 to 35%.  He underwent cardioversion on 02/10/2023.  He reportedly went back into atrial fibrillation on 03/02/2023, which was detected by his Apple Watch.  Patient reports feeling better after his cardioversion, while in normal sinus rhythm.  His primary symptoms are fatigue and occasionally has palpitations.  He denies chest pain, shortness of breath, lower extremity edema.   Interval: Since clinic visit on 03/29/23. Patient converted to NSR with amiodarone. Did not need cardioversion. He had cardiac MRI with normalization in LVEF. He is doing well. No new or acute complaints.   Review of systems complete and found to be negative unless listed in HPI.    EP Information / Studies Reviewed:     EKG is not ordered today. EKG from 03/22/23 reviewed which showed atrial fibrillation with RVR.        Echocardiogram 01/11/23: Moderately decreased LV function, LVEF 30 to 35%.  Global hypokinesis. Normal RV function.  Severely enlarged RV size.  Mildly elevated pulmonary artery systolic pressure.  RVSP estimated 37 mmHg. Severely dilated dilated right and left atrium. No significant valvular disease.     EKG 12/15/22    09/23/21 Coronary calcium score of 0.   Risk Assessment/Calculations:      CHA2DS2-VASc Score = 2   This indicates a 2.2% annual risk of stroke. The patient's score is based upon: CHF History: 1 HTN History: 0 Diabetes History: 0 Stroke History: 0 Vascular Disease History: 0 Age Score: 1 Gender Score: 0               Physical Exam:    Vitals:   05/05/23 0720  BP: (!) 142/83  Pulse: 61  Resp: 17  Temp: 97.9 F (36.6 C)  SpO2: 97%       GEN: Well nourished, well developed in no acute distress NECK: No JVD; No carotid bruits CARDIAC: Normal rate and regular rhythm. RESPIRATORY:  Clear to auscultation without rales, wheezing or rhonchi  ABDOMEN: Soft, non-tender, non-distended EXTREMITIES:  No edema; No deformity    ASSESSMENT AND PLAN:   Joseph Henderson is a 72 y.o. male with newly discovered atrial fibrillation approximately 4 months ago.  He underwent cardioversion to sinus rhythm which reportedly lasted a little over 2 weeks.  While in atrial fibrillation, an echocardiogram was performed which showed a reduced LVEF of 30 to 35%.  Ischemic evaluation was negative.  I suspect he has an arrhythmia/tachycardia induced cardiomyopathy, although further workup with cardiac MRI is pending.    #. Persistent atrial fibrillation: I suspect he has had atrial fibrillation for quite some time given that his he has chamber enlargement on his echo.  He was recently cardioverted to sinus rhythm and reports improvement in his symptoms.  Given that he has an LVEF of 30 to 35%, which I suspect is secondary to an arrhythmia  induced cardiomyopathy, I think it would be best to pursue a rhythm control strategy.  He has been started on amiodarone, but I do not like this is a long-term option given its side effect profile.  We discussed options for dofetilide versus ablation. Discussed risks, recovery and likelihood of success with each treatment strategy. Risk, benefits, and alternatives to EP study and ablation for afib were discussed. These risks include but are not limited  to stroke, bleeding, vascular damage, tamponade, perforation, damage to the esophagus, lungs, phrenic nerve and other structures, pulmonary vein stenosis, worsening renal function, coronary vasospasm and death.  Discussed potential need for repeat ablation procedures and antiarrhythmic drugs after an initial ablation. The patient understands these risk and wishes to proceed.   -Afib ablation today. Will stop amiodarone in 1 month.   #.  Hypercoagulable state secondary to atrial fibrillation: -He has a CHA2DS2-VASc score of 2. -Continue Eliquis.  He denies any missed doses.   #. Chronic systolic heart failure: Likely secondary to tachycardia/arrhythmia-induced cardiomyopathy. -Cardiac MRI with normalization of LVEF. -Continue GDMT and close follow-up with Dr. Gala Romney.     Signed, Nobie Putnam, MD

## 2023-05-05 NOTE — Progress Notes (Addendum)
Patient walked the hallway without difficulties, bilateral groin sites level 0, clean, dry, and intact. Patient had difficulties urinating, in and out cath was done at 1550, with 600 ml output. Patient stayed hydrated until he urinated at 42. Patient discharged home.

## 2023-05-06 ENCOUNTER — Encounter (HOSPITAL_COMMUNITY): Payer: Self-pay | Admitting: Cardiology

## 2023-05-06 MED FILL — Fentanyl Citrate Preservative Free (PF) Inj 100 MCG/2ML: INTRAMUSCULAR | Qty: 2 | Status: AC

## 2023-05-10 ENCOUNTER — Other Ambulatory Visit (HOSPITAL_COMMUNITY): Payer: Self-pay

## 2023-05-10 ENCOUNTER — Ambulatory Visit (HOSPITAL_COMMUNITY)
Admission: RE | Admit: 2023-05-10 | Discharge: 2023-05-10 | Disposition: A | Discharge status: Home / Self Care | Payer: Medicare HMO | Source: Ambulatory Visit | Attending: Cardiology | Admitting: Cardiology

## 2023-05-10 DIAGNOSIS — I5022 Chronic systolic (congestive) heart failure: Secondary | ICD-10-CM

## 2023-05-10 NOTE — Addendum Note (Signed)
Encounter addended by: Evon Slack, RPH-CPP on: 05/10/2023 9:43 AM  Actions taken: Clinical Note Signed

## 2023-05-11 ENCOUNTER — Other Ambulatory Visit (HOSPITAL_COMMUNITY): Payer: Medicare HMO

## 2023-05-13 ENCOUNTER — Other Ambulatory Visit (HOSPITAL_COMMUNITY): Payer: Medicare HMO

## 2023-05-14 ENCOUNTER — Telehealth: Payer: Self-pay | Admitting: *Deleted

## 2023-05-14 DIAGNOSIS — G4733 Obstructive sleep apnea (adult) (pediatric): Secondary | ICD-10-CM

## 2023-05-14 DIAGNOSIS — I5022 Chronic systolic (congestive) heart failure: Secondary | ICD-10-CM

## 2023-05-14 DIAGNOSIS — G4719 Other hypersomnia: Secondary | ICD-10-CM

## 2023-05-14 NOTE — Telephone Encounter (Signed)
-----   Message from [name] sent at 04/18/2023  8:52 PM EDT ----- Please let patient know that they have sleep apnea and recommend treating with CPAP.  Please order an auto CPAP from 4-15cm H2O with heated humidity and mask of choice.  Order overnight pulse ox on CPAP.  Followup with me in 6 weeks.

## 2023-05-14 NOTE — Telephone Encounter (Addendum)
The patient has been notified of the result. Left detailed message on voicemail and informed patient to call back. Joseph Henderson, CMA.    Return call: Patient called back and he would like a referral to try the oral appliance to try first.

## 2023-05-27 NOTE — Telephone Encounter (Signed)
APPOINTMENT SET UP FOR 06/07/23.

## 2023-06-02 ENCOUNTER — Ambulatory Visit (HOSPITAL_COMMUNITY)
Admission: RE | Admit: 2023-06-02 | Discharge: 2023-06-02 | Disposition: A | Discharge status: Home / Self Care | Payer: Medicare HMO | Source: Ambulatory Visit | Attending: Physician Assistant | Admitting: Physician Assistant

## 2023-06-02 VITALS — BP 120/64 | HR 58 | Ht 72.0 in | Wt 217.8 lb

## 2023-06-02 DIAGNOSIS — Z7901 Long term (current) use of anticoagulants: Secondary | ICD-10-CM | POA: Insufficient documentation

## 2023-06-02 DIAGNOSIS — G4733 Obstructive sleep apnea (adult) (pediatric): Secondary | ICD-10-CM | POA: Insufficient documentation

## 2023-06-02 DIAGNOSIS — Z5181 Encounter for therapeutic drug level monitoring: Secondary | ICD-10-CM | POA: Diagnosis not present

## 2023-06-02 DIAGNOSIS — I5022 Chronic systolic (congestive) heart failure: Secondary | ICD-10-CM | POA: Diagnosis not present

## 2023-06-02 DIAGNOSIS — R001 Bradycardia, unspecified: Secondary | ICD-10-CM | POA: Insufficient documentation

## 2023-06-02 DIAGNOSIS — I4819 Other persistent atrial fibrillation: Secondary | ICD-10-CM | POA: Insufficient documentation

## 2023-06-02 DIAGNOSIS — Z79899 Other long term (current) drug therapy: Secondary | ICD-10-CM | POA: Diagnosis not present

## 2023-06-02 DIAGNOSIS — D6869 Other thrombophilia: Secondary | ICD-10-CM | POA: Diagnosis not present

## 2023-06-02 NOTE — Progress Notes (Addendum)
Primary Care Physician: Geoffry Paradise, MD Primary Cardiologist: Nanetta Batty, MD Electrophysiologist: Nobie Putnam, MD  [facility]: Dr Gala Romney  Referring Physician: Jari Favre, PA-C     Joseph Henderson is a 72 y.o. male with a history of paroxysmal atrial fibrillation who presents for follow up in the [facility]. History of coronary calcium score of 0 in 09/2021. Seen by Cardiology on 12/15/22 due to home health RN appreciating an abnormal heart rhythm per report. He was noted to be in Afib with RVR. Started on metoprolol 12.5 mg BID. Patient is on Eliquis 5 mg BID for a CHADS2VASC score of 2.  S/p successful DCCV on 02/10/23 but had early return of afib. He was seen by Dr Jimmey Ralph and is s/p afib ablation on 05/05/23.  On follow up today, patient reports that he has done very well since his ablation. He denies any interim symptoms of afib and his smart watch has shown only SR. He denies any chest pain or groin issues.   Today, he denies symptoms of palpitations, chest pain, shortness of breath, orthopnea, PND, lower extremity edema, dizziness, presyncope, syncope, snoring, daytime somnolence, bleeding, or neurologic sequela. The patient is tolerating medications without difficulties and is otherwise without complaint today.    Atrial Fibrillation Risk Factors:  he does have a diagnosis of sleep apnea.    Current Outpatient Medications  Medication Sig Dispense Refill   amiodarone (PACERONE) 200 MG tablet Take 1 tablet (200 mg total) by mouth daily. Take 400 mg by mouth twice daily (Patient taking differently: Take 200 mg by mouth daily.)     apixaban (ELIQUIS) 5 MG TABS tablet Take 1 tablet (5 mg total) by mouth 2 (two) times daily. 180 tablet 2   aspirin EC 81 MG tablet Take 81 mg by mouth every evening. Swallow whole.     Cetirizine HCl (ZYRTEC ALLERGY) 10 MG CAPS Take 10 mg by mouth every evening.     FARXIGA 10 MG TABS tablet Take 1 tablet (10 mg total)  by mouth daily before breakfast. 30 tablet 11   fluorouracil (EFUDEX) 5 % cream Apply 1 Application topically 2 (two) times daily as needed (precancerous spots).     fluticasone (KLS ALLER-FLO) 50 MCG/ACT nasal spray Place 2 sprays into both nostrils daily as needed for allergies or rhinitis.     Glucosamine-Chondroit-Vit C-Mn (GLUCOSAMINE CHONDR 1500 COMPLX PO) Take 1 tablet by mouth 2 (two) times daily.     ibuprofen (ADVIL) 200 MG tablet Take 400 mg by mouth as needed for moderate pain (pain score 4-6).     Linoleic Acid Conjugated 1200 MG CAPS Take 2,400 mg by mouth daily.     losartan (COZAAR) 25 MG tablet Take 1 tablet (25 mg total) by mouth daily. 90 tablet 3   melatonin 5 MG TABS Take 5 mg by mouth at bedtime.     metoprolol succinate (TOPROL-XL) 50 MG 24 hr tablet Take 1 tablet (50 mg total) by mouth daily. Take with or immediately following a meal. 90 tablet 3   Multiple Vitamin (MULTIVITAMIN ADULT) TABS Take 1 tablet by mouth every evening.     omeprazole (PRILOSEC) 20 MG capsule Take 20 mg by mouth every morning.     oxymetazoline (AFRIN) 0.05 % nasal spray Place 1 spray into both nostrils 2 (two) times daily as needed for congestion.     TURMERIC CURCUMIN PO Take 1,000 mg by mouth daily.     No current facility-administered medications for  this encounter.    Atrial Fibrillation Management history:  Previous antiarrhythmic drugs: amiodarone  Previous cardioversions: 02/10/23 Previous ablations: 05/05/23 Anticoagulation history: Eliquis   ROS- All systems are reviewed and negative except as per the HPI above.  Physical Exam: BP 120/64   Pulse (!) 58   Ht 6' (1.829 m)   Wt 98.8 kg   BMI 29.54 kg/m    GEN: Well nourished, well developed in no acute distress NECK: No JVD; No carotid bruits CARDIAC: Regular rate and rhythm, no murmurs, rubs, gallops RESPIRATORY:  Clear to auscultation without rales, wheezing or rhonchi  ABDOMEN: Soft, non-tender,  non-distended EXTREMITIES:  No edema; No deformity     EKG today demonstrates  SB Vent. rate 58 BPM PR interval 174 ms QRS duration 100 ms QT/QTcB 478/469 ms   Echo 01/11/23 demonstrated   1. Left ventricular ejection fraction, by estimation, is 30 to 35%. The  left ventricle has moderately decreased function. The left ventricle  demonstrates global hypokinesis. Left ventricular diastolic parameters are  indeterminate.   2. Right ventricular systolic function is normal. The right ventricular  size is severely enlarged. There is mildly elevated pulmonary artery  systolic pressure. The estimated right ventricular systolic pressure is  36.9 mmHg.   3. Left atrial size was severely dilated.   4. Right atrial size was severely dilated.   5. The mitral valve is grossly normal. Mild to moderate mitral valve  regurgitation. No evidence of mitral stenosis.   6. Tricuspid valve regurgitation is mild to moderate.   7. The aortic valve is tricuspid. Aortic valve regurgitation is mild. No  aortic stenosis is present.   8. The inferior vena cava is dilated in size with >50% respiratory  variability, suggesting right atrial pressure of 8 mmHg.    CHA2DS2-VASc Score = 2  The patient's score is based upon: CHF History: 1 HTN History: 0 Diabetes History: 0 Stroke History: 0 Vascular Disease History: 0 Age Score: 1 Gender Score: 0       ASSESSMENT AND PLAN: Persistent Atrial Fibrillation (ICD10:  I48.19) The patient's CHA2DS2-VASc score is 2, indicating a 2.2% annual risk of stroke.   S/p afib ablation 05/05/23 Patient appears to be maintaining SR Continue amiodarone 200 mg daily for now, anticipate this will be short term post ablation. Continue Eliquis 5 mg BID with no missed doses for 3 months post ablation Continue Toprol 50 mg daily  Secondary Hypercoagulable State (ICD10:  D68.69) The patient is at significant risk for stroke/thromboembolism based upon his CHA2DS2-VASc Score  of 2.  Continue Apixaban (Eliquis).   Chronic HFrEF EF 30-35% GDMT per [facility] Fluid status appears stable today  OSA  Mild on sleep study 04/06/23 Patient plans to try oral device before CPAP   Follow up with Dr Jimmey Ralph as scheduled.     Jorja Loa PA-C Afib Clinic [facility] [address] [place], [place] [address] [phone]

## 2023-06-03 ENCOUNTER — Encounter: Payer: Self-pay | Admitting: Cardiology

## 2023-06-03 DIAGNOSIS — I5022 Chronic systolic (congestive) heart failure: Secondary | ICD-10-CM

## 2023-06-03 DIAGNOSIS — I4819 Other persistent atrial fibrillation: Secondary | ICD-10-CM

## 2023-06-07 ENCOUNTER — Encounter: Payer: Self-pay | Admitting: Cardiology

## 2023-06-07 ENCOUNTER — Ambulatory Visit: Payer: Medicare HMO | Attending: Cardiology | Admitting: Cardiology

## 2023-06-07 ENCOUNTER — Encounter (HOSPITAL_COMMUNITY): Payer: Self-pay | Admitting: Internal Medicine

## 2023-06-07 ENCOUNTER — Ambulatory Visit (HOSPITAL_COMMUNITY)
Admission: RE | Admit: 2023-06-07 | Discharge: 2023-06-07 | Disposition: A | Discharge status: Home / Self Care | Payer: Medicare HMO | Source: Ambulatory Visit | Attending: Internal Medicine | Admitting: Internal Medicine

## 2023-06-07 VITALS — BP 136/84 | HR 63 | Ht 72.0 in | Wt 218.0 lb

## 2023-06-07 VITALS — BP 117/72 | HR 61 | Ht 72.0 in | Wt 215.0 lb

## 2023-06-07 DIAGNOSIS — G4733 Obstructive sleep apnea (adult) (pediatric): Secondary | ICD-10-CM | POA: Diagnosis not present

## 2023-06-07 DIAGNOSIS — Z79899 Other long term (current) drug therapy: Secondary | ICD-10-CM | POA: Insufficient documentation

## 2023-06-07 DIAGNOSIS — Z7901 Long term (current) use of anticoagulants: Secondary | ICD-10-CM | POA: Insufficient documentation

## 2023-06-07 DIAGNOSIS — I340 Nonrheumatic mitral (valve) insufficiency: Secondary | ICD-10-CM | POA: Insufficient documentation

## 2023-06-07 DIAGNOSIS — I429 Cardiomyopathy, unspecified: Secondary | ICD-10-CM | POA: Diagnosis not present

## 2023-06-07 DIAGNOSIS — I4819 Other persistent atrial fibrillation: Secondary | ICD-10-CM | POA: Diagnosis not present

## 2023-06-07 DIAGNOSIS — I5022 Chronic systolic (congestive) heart failure: Secondary | ICD-10-CM | POA: Diagnosis not present

## 2023-06-07 NOTE — Addendum Note (Signed)
Encounter addended by: Noralee Space, RN on: 06/07/2023 9:50 AM  Actions taken: Clinical Note Signed

## 2023-06-07 NOTE — Patient Instructions (Signed)
Your physician recommends that you schedule a follow-up appointment in: 1 year  

## 2023-06-07 NOTE — Progress Notes (Signed)
ADVANCED HF CLINIC  NOTE  Referring Physician: Geoffry Paradise, MD Primary Care: Geoffry Paradise, MD Primary Cardiologist: Dr. Allyson Sabal (formerly Garnette Scheuermann. MD)  HPI:  Joseph Henderson is a 72 y.o.male with h/o atrial fibrillation and cardiomyopathy who is referred by Dr. Allyson Sabal for further evaluation of his cardiomyopathy.   He has no h/o cardiac risk factors and has been very active -= played minor league baseball. CAC = 0 in 8/23  He was found to be in A-fib by a insurance nurse in 6/24. He was referred to Cardiology. He was noted to be in AF with RVR. Started on metoprolol and Eliquis. Referred to EP and echo ordered  Echo 01/11/23 showed EF 30-35% RV moderately enlarged/moderate HK with severe biatrial enlargement.   Seen in AF Clinic on 02/09/23 and was still in AF with RVR at rate 117.   He underwent successful DC cardioversion by Dr. Mayford Knife on 02/10/23   Was seen back in AF clinic on 03/03/23 and was back in AF (based on AppleWatch AF returned on 03/02/23). Amiodarone started on 03/15/23 and referred for ablation.   Underwent AF ablation on 05/05/23  cMRI 11/24: LVEF 59% no LGE. Normal ECV. RV upper normal size. EF 54%. Severe biatrial enlargement  Moderate mitral regurgitation by regurgitant fraction 29%.  Feels great. Going to gym regularly. No undue SOB. Remains in NSR. No recurrence of AF on Apple Watch.    Sleep study AHI 8.2. Saw Dr. Mayford Knife   PMHx: as per HPI  Current Outpatient Medications  Medication Sig Dispense Refill   amiodarone (PACERONE) 200 MG tablet Take 1 tablet (200 mg total) by mouth daily. Take 400 mg by mouth twice daily     apixaban (ELIQUIS) 5 MG TABS tablet Take 1 tablet (5 mg total) by mouth 2 (two) times daily. 180 tablet 2   aspirin EC 81 MG tablet Take 81 mg by mouth every evening. Swallow whole.     Cetirizine HCl (ZYRTEC ALLERGY) 10 MG CAPS Take 10 mg by mouth every evening.     FARXIGA 10 MG TABS tablet Take 1 tablet (10 mg total) by mouth daily  before breakfast. 30 tablet 11   fluorouracil (EFUDEX) 5 % cream Apply 1 Application topically 2 (two) times daily as needed (precancerous spots).     fluticasone (KLS ALLER-FLO) 50 MCG/ACT nasal spray Place 2 sprays into both nostrils daily as needed for allergies or rhinitis.     Glucosamine-Chondroit-Vit C-Mn (GLUCOSAMINE CHONDR 1500 COMPLX PO) Take 1 tablet by mouth 2 (two) times daily.     ibuprofen (ADVIL) 200 MG tablet Take 400 mg by mouth as needed for moderate pain (pain score 4-6).     Linoleic Acid Conjugated 1200 MG CAPS Take 2,400 mg by mouth daily.     losartan (COZAAR) 25 MG tablet Take 1 tablet (25 mg total) by mouth daily. 90 tablet 3   melatonin 5 MG TABS Take 5 mg by mouth at bedtime.     metoprolol succinate (TOPROL-XL) 50 MG 24 hr tablet Take 1 tablet (50 mg total) by mouth daily. Take with or immediately following a meal. 90 tablet 3   Multiple Vitamin (MULTIVITAMIN ADULT) TABS Take 1 tablet by mouth every evening.     omeprazole (PRILOSEC) 20 MG capsule Take 20 mg by mouth every morning.     oxymetazoline (AFRIN) 0.05 % nasal spray Place 1 spray into both nostrils 2 (two) times daily as needed for congestion.     TURMERIC CURCUMIN PO  Take 1,000 mg by mouth daily.     No current facility-administered medications for this encounter.    Allergies  Allergen Reactions   Amoxicillin Hives and Rash      Social History   Socioeconomic History   Marital status: Married    Spouse name: Not on file   Number of children: Not on file   Years of education: Not on file   Highest education level: Not on file  Occupational History   Not on file  Tobacco Use   Smoking status: Never   Smokeless tobacco: Never   Tobacco comments:    Never smoked 02/09/23  Substance and Sexual Activity   Alcohol use: Yes    Alcohol/week: 8.0 standard drinks of alcohol    Types: 8 Glasses of wine per week    Comment: 4 glasses of wine or mixed drinks a week 02/09/23   Drug use: No    Sexual activity: Not on file  Other Topics Concern   Not on file  Social History Narrative   Not on file   Social Drivers of Health   Financial Resource Strain: Not on file  Food Insecurity: Not on file  Transportation Needs: Not on file  Physical Activity: Not on file  Stress: Not on file  Social Connections: Not on file  Intimate Partner Violence: Not on file     No family history on file.  Vitals:   06/07/23 0916  BP: 136/84  Pulse: 63  SpO2: 96%  Weight: 98.9 kg (218 lb)  Height: 6' (1.829 m)    PHYSICAL EXAM: General:  Well appearing. No resp difficulty HEENT: normal Neck: supple. no JVD. Carotids 2+ bilat; no bruits. No lymphadenopathy or thryomegaly appreciated. Cor: PMI nondisplaced. Regular rate & rhythm. No rubs, gallops or murmurs. Lungs: clear Abdomen: soft, nontender, nondistended. No hepatosplenomegaly. No bruits or masses. Good bowel sounds. Extremities: no cyanosis, clubbing, rash, edema Neuro: alert & orientedx3, cranial nerves grossly intact. moves all 4 extremities w/o difficulty. Affect pleasant   ASSESSMENT & PLAN:  1. Chronic systolic HF  with recovered EF - Echo 01/11/23 EF 30-35% RV moderately enlarged/HK. 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 Normal ECV. RV upper normal size. EF 54%. Severe biatrial enlargement  Moderate mitral regurgitation by regurgitant fraction 29%. Marland Kitchen NYHA I - Volume status lloks good - Continue Toprol 50 - Continue losartan 25 - We discussed [place]. Suggest continuing  2. Persistent AF - discovered incidentally in 5/24 - s/p DC-CV on 02/10/23 with recurrence of AF 03/03/23 - Followed by AF Clinic. Amio started 03/15/23 - Underwent AF ablation on  05/05/23 - On Eliquis 5 bid. Np bleeding - Stop amio Jul 04, 2023 per Dr. Jimmey Ralph - Remains in NSR  3. Mild OSA - AHI 8.2  - I recommended losing 10-15 pounds   Arvilla Meres, MD  9:23 AM

## 2023-06-07 NOTE — Progress Notes (Signed)
SLEEP MEDICINE VIRTUAL CONSULT NOTE via Video Note   Because of Joseph Henderson's co-morbid illnesses, he is at least at moderate risk for complications without adequate follow up.  This format is felt to be most appropriate for this patient at this time.  All issues noted in this document were discussed and addressed.  A limited physical exam was performed with this format.  Please refer to the patient's chart for his consent to telehealth for [facility].      Date:  06/07/2023   ID:  [name], DOB 1951-03-06, MRN [id number] The patient was identified using 2 identifiers.  Patient Location: Home Provider Location: Home Office   PCP:  Geoffry Paradise, MD   [facility] Providers Cardiologist:  Nanetta Batty, MD Electrophysiologist:  Nobie Putnam, MD  Sleep Medicine:  [name], MD     Evaluation Performed:  New Patient Evaluation  Chief Complaint:   History of Present Illness:    Joseph Henderson is a 72 y.o. male who is being seen today for the evaluation of OSA at the request of Arvilla Meres, MD.  Joseph Henderson is a 72 y.o. male with a hx of PAF who was referred for sleep study due to PAF.  He had no excessive daytime sleepiness complaints He underwent HST on 04/06/2023 showing mild OSA with an AHI of 8.2/hr.  CPAP was recommended but patient wanted to discuss using an oral dental device.    His wife says that she has not noticed hiim snoring or stop breathing in his sleep.  He wakes up feeling refreshed but does occasionally take a 10 minute nap in the afternoon.  No problems staying awake when he needs to and has never fallen asleep driving.     Past Medical History:  Diagnosis Date   OSA (obstructive sleep apnea)    mild OSA with an AHI of 8.2/hr.   Past Surgical History:  Procedure Laterality Date   ATRIAL FIBRILLATION ABLATION N/A 05/05/2023   Procedure: ATRIAL FIBRILLATION ABLATION;  Surgeon: Nobie Putnam, MD;  Location: [facility] INVASIVE  CV LAB;  Service: Cardiovascular;  Laterality: N/A;   CARDIOVERSION N/A 02/10/2023   Procedure: CARDIOVERSION;  Surgeon: Quintella Reichert, MD;  Location: [facility] INVASIVE CV LAB;  Service: Cardiovascular;  Laterality: N/A;   COLONOSCOPY  2007   ESOPHAGOGASTRODUODENOSCOPY  06/09/2011   Procedure: ESOPHAGOGASTRODUODENOSCOPY (EGD);  Surgeon: Rob Bunting, MD;  Location: [facility] ENDOSCOPY;  Service: Endoscopy;  Laterality: N/A;     Current Meds  Medication Sig   amiodarone (PACERONE) 200 MG tablet Take 1 tablet (200 mg total) by mouth daily. Take 400 mg by mouth twice daily (Patient taking differently: Take 200 mg by mouth daily.)   apixaban (ELIQUIS) 5 MG TABS tablet Take 1 tablet (5 mg total) by mouth 2 (two) times daily.   aspirin EC 81 MG tablet Take 81 mg by mouth every evening. Swallow whole.   Cetirizine HCl (ZYRTEC ALLERGY) 10 MG CAPS Take 10 mg by mouth every evening.   FARXIGA 10 MG TABS tablet Take 1 tablet (10 mg total) by mouth daily before breakfast.   fluorouracil (EFUDEX) 5 % cream Apply 1 Application topically 2 (two) times daily as needed (precancerous spots).   fluticasone (KLS ALLER-FLO) 50 MCG/ACT nasal spray Place 2 sprays into both nostrils daily as needed for allergies or rhinitis.   Glucosamine-Chondroit-Vit C-Mn (GLUCOSAMINE CHONDR 1500 COMPLX PO) Take 1 tablet by mouth 2 (two) times daily.   ibuprofen (ADVIL) 200  MG tablet Take 400 mg by mouth as needed for moderate pain (pain score 4-6).   Linoleic Acid Conjugated 1200 MG CAPS Take 2,400 mg by mouth daily.   losartan (COZAAR) 25 MG tablet Take 1 tablet (25 mg total) by mouth daily.   melatonin 5 MG TABS Take 5 mg by mouth at bedtime.   metoprolol succinate (TOPROL-XL) 50 MG 24 hr tablet Take 1 tablet (50 mg total) by mouth daily. Take with or immediately following a meal.   Multiple Vitamin (MULTIVITAMIN ADULT) TABS Take 1 tablet by mouth every evening.   omeprazole (PRILOSEC) 20 MG capsule Take 20 mg by mouth every morning.    oxymetazoline (AFRIN) 0.05 % nasal spray Place 1 spray into both nostrils 2 (two) times daily as needed for congestion.   TURMERIC CURCUMIN PO Take 1,000 mg by mouth daily.     Allergies:   Amoxicillin   Social History   Tobacco Use   Smoking status: Never   Smokeless tobacco: Never   Tobacco comments:    Never smoked 02/09/23  Substance Use Topics   Alcohol use: Yes    Alcohol/week: 8.0 standard drinks of alcohol    Types: 8 Glasses of wine per week    Comment: 4 glasses of wine or mixed drinks a week 02/09/23   Drug use: No     Family Hx: The patient's family history is not on file.  ROS:   Please see the history of present illness.     All other systems reviewed and are negative.   Prior Sleep studies:   The following studies were reviewed today:  HST and PAP compliance download  Labs/Other Tests and Data Reviewed:     Recent Labs: 12/15/2022: Magnesium 2.1; TSH 1.170 04/19/2023: BUN 18; Creatinine, Ser 1.18; Hemoglobin 15.0; Platelets 206; Potassium 4.7; Sodium 139    Wt Readings from Last 3 Encounters:  06/07/23 215 lb (97.5 kg)  06/02/23 217 lb 12.8 oz (98.8 kg)  05/05/23 215 lb (97.5 kg)     Risk Assessment/Calculations:    CHA2DS2-VASc Score = 2   This indicates a 2.2% annual risk of stroke. The patient's score is based upon: CHF History: 1 HTN History: 0 Diabetes History: 0 Stroke History: 0 Vascular Disease History: 0 Age Score: 1 Gender Score: 0         Objective:    Vital Signs:  BP 117/72   Pulse 61   Ht 6' (1.829 m)   Wt 215 lb (97.5 kg)   BMI 29.16 kg/m    VITAL SIGNS:  reviewed GEN:  no acute distress EYES:  sclerae anicteric, EOMI - Extraocular Movements Intact RESPIRATORY:  normal respiratory effort, symmetric expansion CARDIOVASCULAR:  no peripheral edema SKIN:  no rash, lesions or ulcers. MUSCULOSKELETAL:  no obvious deformities. NEURO:  alert and oriented x 3, no obvious focal deficit PSYCH:  normal  affect  ASSESSMENT & PLAN:    OSA  -mild OSA with an AHI of 8.2/hr.  -we have discussed the treatment options for OSA including CPAP and the oral device -he would like to try the oral device since his OSA is so mild and he really has not symptoms or OSA other than PAF -I will refer him to Dr. Toni Arthurs for evaluation for for the oral device   Time:   Today, I have spent 15 minutes with the patient with telehealth technology discussing the above problems.     Medication Adjustments/Labs and Tests Ordered: Current medicines are reviewed at  length with the patient today.  Concerns regarding medicines are outlined above.   Tests Ordered: No orders of the defined types were placed in this encounter.   Medication Changes: No orders of the defined types were placed in this encounter.   Follow Up:      prn  Signed, [name], MD  06/07/2023 8:18 AM    [facility]

## 2023-06-07 NOTE — Addendum Note (Signed)
Addended by: Frutoso Schatz on: 06/07/2023 08:26 AM   Modules accepted: Orders

## 2023-06-07 NOTE — Patient Instructions (Signed)
Medication Instructions:  Your physician recommends that you continue on your current medications as directed. Please refer to the Current Medication list given to you today.  *If you need a refill on your cardiac medications before your next appointment, please call your pharmacy*  Follow-Up: At [facility], you and your health needs are our priority.  As part of our continuing mission to provide you with exceptional heart care, we have created designated Provider Care Teams.  These Care Teams include your primary Cardiologist (physician) and Advanced Practice Providers (APPs -  Physician Assistants and Nurse Practitioners) who all work together to provide you with the care you need, when you need it.  Your next appointment:   As needed with Dr. Mayford Knife Other Instructions You have been referred to Dr. Toni Arthurs to discuss an oral device for sleep apnea

## 2023-06-22 ENCOUNTER — Telehealth: Payer: Self-pay | Admitting: Cardiology

## 2023-06-22 NOTE — Telephone Encounter (Signed)
Patient states he is returning a call from [place], [place], but he doesn't know what it was in regards to. Please advise.

## 2023-06-22 NOTE — Telephone Encounter (Signed)
Spoke with the patient. He is unsure when the message was left for him and states that it could be old. I do not know of any reason that I would have called him recently. Will call back if needed.

## 2023-08-16 ENCOUNTER — Ambulatory Visit (HOSPITAL_COMMUNITY): Payer: HMO | Attending: Cardiology

## 2023-08-16 DIAGNOSIS — I4819 Other persistent atrial fibrillation: Secondary | ICD-10-CM | POA: Insufficient documentation

## 2023-08-16 DIAGNOSIS — I5022 Chronic systolic (congestive) heart failure: Secondary | ICD-10-CM | POA: Insufficient documentation

## 2023-08-16 LAB — ECHOCARDIOGRAM COMPLETE
Est EF: 55
P 1/2 time: 517 ms
S' Lateral: 3.2 cm

## 2023-08-18 ENCOUNTER — Encounter: Payer: Self-pay | Admitting: Cardiology

## 2023-08-20 ENCOUNTER — Ambulatory Visit: Payer: HMO | Attending: Cardiology | Admitting: Cardiology

## 2023-08-20 ENCOUNTER — Encounter: Payer: Self-pay | Admitting: Cardiology

## 2023-08-20 VITALS — BP 108/70 | HR 58 | Ht 72.0 in | Wt 215.0 lb

## 2023-08-20 DIAGNOSIS — I4819 Other persistent atrial fibrillation: Secondary | ICD-10-CM | POA: Diagnosis not present

## 2023-08-20 DIAGNOSIS — I5022 Chronic systolic (congestive) heart failure: Secondary | ICD-10-CM

## 2023-08-20 DIAGNOSIS — D6869 Other thrombophilia: Secondary | ICD-10-CM | POA: Diagnosis not present

## 2023-08-20 NOTE — Progress Notes (Signed)
 Electrophysiology Office Note:   Date:  08/21/2023  ID:  Joseph Henderson, DOB 08/19/1950, MRN [id number]  Primary Cardiologist: Nanetta Batty, MD Electrophysiologist: Nobie Putnam, MD      History of Present Illness:   Joseph Henderson is a 73 y.o. male with h/o persistent atrial fibrillation and chronic systolic heart failure secondary to likely tachycardia/arrhythmia induced cardiomyopathy seen today for follow up Electrophysiology evaluation of atrial fibrillation. Patient underwent catheter ablation on 05/05/23.  Discussed the use of AI scribe software for clinical note transcription with the patient, who gave verbal consent to proceed.  History of Present Illness   The patient presents for a three-month follow-up after AF ablation. He reports feeling well with no recurrence of AFib since the procedure. The patient has been adhering to his medication regimen, which includes metoprolol and Eliquis, and stopped amiodarone one month post-procedure as advised. He had repeat echocardiogram after ablation with normalization of his LVEF. He has no new or acute complaints today.     Review of systems complete and found to be negative unless listed in HPI.   EP Information / Studies Reviewed:    EKG is ordered today. Personal review as below.  EKG Interpretation Date/Time:  Friday August 20 2023 16:22:58 EST Ventricular Rate:  58 PR Interval:  202 QRS Duration:  94 QT Interval:  454 QTC Calculation: 445 R Axis:   0  Text Interpretation: Sinus bradycardia Nonspecific ST abnormality When compared with ECG of 02-Jun-2023 15:12, No significant change was found Confirmed by Nobie Putnam [phone]) on 08/21/2023 11:40:50 AM   Echo 08/16/23: Post ablation, in NSR:  1. Left ventricular ejection fraction, by estimation, is 55%. Left  ventricular ejection fraction by 3D volume is 56 %. The left ventricle has  normal function. The left ventricle has no regional wall motion  abnormalities. Left ventricular  diastolic  parameters are indeterminate. The average left ventricular global  longitudinal strain is -19.0 %. The global longitudinal strain is normal.   2. Right ventricular systolic function is normal. The right ventricular  size is normal.   3. Left atrial size was moderately dilated.   4. Right atrial size was moderately dilated.   5. The mitral valve is normal in structure. Mild mitral valve  regurgitation. No evidence of mitral stenosis.   6. The aortic valve is normal in structure. Aortic valve regurgitation is  mild to moderate. No aortic stenosis is present. Aortic regurgitation PHT  measures 517 msec.   7. Aortic dilatation noted. There is borderline dilatation of the  ascending aorta, measuring 39 mm.   8. The inferior vena cava is normal in size with greater than 50%  respiratory variability, suggesting right atrial pressure of 3 mmHg.    Risk Assessment/Calculations:    CHA2DS2-VASc Score = 2   This indicates a 2.2% annual risk of stroke. The patient's score is based upon: CHF History: 1 HTN History: 0 Diabetes History: 0 Stroke History: 0 Vascular Disease History: 0 Age Score: 1 Gender Score: 0             Physical Exam:   VS:  BP 108/70   Pulse (!) 58   Ht 6' (1.829 m)   Wt 215 lb (97.5 kg)   BMI 29.16 kg/m    Wt Readings from Last 3 Encounters:  08/20/23 215 lb (97.5 kg)  06/07/23 218 lb (98.9 kg)  06/07/23 215 lb (97.5 kg)     GEN: Well nourished, well developed in no acute distress NECK: No  JVD CARDIAC: Bradycardic, regular. RESPIRATORY:  Clear to auscultation without rales, wheezing or rhonchi  ABDOMEN: Soft, non-distended EXTREMITIES:  No edema; No deformity   ASSESSMENT AND PLAN:   Joseph Henderson is a 73 y.o. male with persistent atrial fibrillation and tachy/arrhythmia induced cardiomyopathy. While in atrial fibrillation, an echocardiogram was performed which showed a reduced LVEF of 30 to 35%.  Ischemic evaluation was negative. He underwent  catheter ablation for atrial fibrillation on 05/05/23. He has maintained sinus rhythm since. Repeat echo after ablation, in NSR, showed normalization of LVEF.   #. Persistent atrial fibrillation: S/p ablation 05/05/23 without recurrence. - We have stopped amiodarone. - Continue metoprolol.  - Continue Eliquis.     #.  Hypercoagulable state secondary to atrial fibrillation: -He has a CHA2DS2-VASc score of 2. -Continue Eliquis.  He denies any missed doses.   #. Chronic systolic heart failure: Likely secondary to tachycardia/arrhythmia-induced cardiomyopathy. - EF normalized. Cardiac MRI without LGE. - Continue GDMT and close follow-up with Dr. Gala Romney.   Follow up with Dr. Jimmey Ralph in 12 months  Signed, Nobie Putnam, MD

## 2023-08-20 NOTE — Patient Instructions (Signed)
 Medication Instructions:  Your physician has recommended you make the following change in your medication:  1) STOP taking amiodarone  *If you need a refill on your cardiac medications before your next appointment, please call your pharmacy*  Follow-Up: At [facility], you and your health needs are our priority.  As part of our continuing mission to provide you with exceptional heart care, we have created designated Provider Care Teams.  These Care Teams include your primary Cardiologist (physician) and Advanced Practice Providers (APPs -  Physician Assistants and Nurse Practitioners) who all work together to provide you with the care you need, when you need it.   Your next appointment:   1 year   Provider:   You may see Nobie Putnam, MD or one of the following Advanced Practice Providers on your designated Care Team:   Francis Dowse, [place] [address] [place], [place] Sherie Don, NP Canary Brim, NP

## 2023-08-27 IMAGING — CT CT CARDIAC CORONARY ARTERY CALCIUM SCORE
3 series · 14 of 20 positions shown, 16 images · non-contrast
Comparison: None.
COMPARISON: None.

Addendum:
EXAM:
OVER-READ INTERPRETATION  CT CHEST

The following report is an over-read performed by radiologist Dr.
Mircio Sakamoto [REDACTED] on 09/23/2021. This
over-read does not include interpretation of cardiac or coronary
anatomy or pathology. The coronary calcium score interpretation by
the cardiologist is attached.
CLINICAL DATA: Cardiovascular Disease Risk stratification
Coronary Calcium Score
TECHNIQUE: A gated, non-contrast computed tomography scan of the heart was
performed using 3mm slice thickness. Axial images were analyzed on a
dedicated workstation. Calcium scoring of the coronary arteries was
performed using the Agatston method.

[Series 2: cascseq 2.0 sa36 70% (id) · axial · 0.38mm/px · z∈[-254,-164]mm · 4 of 76 slices shown]
[im 16/76  vessel]
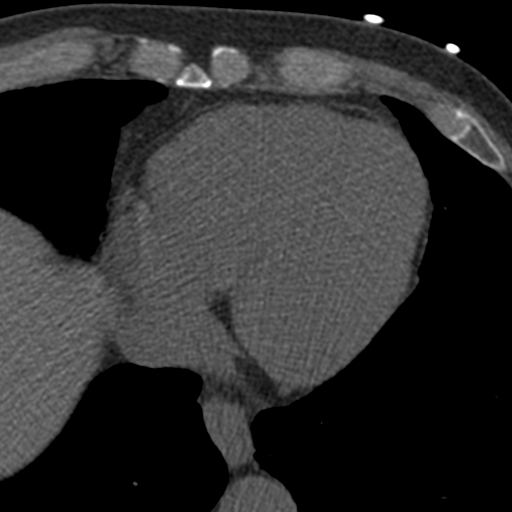
[im 31/76  vessel]
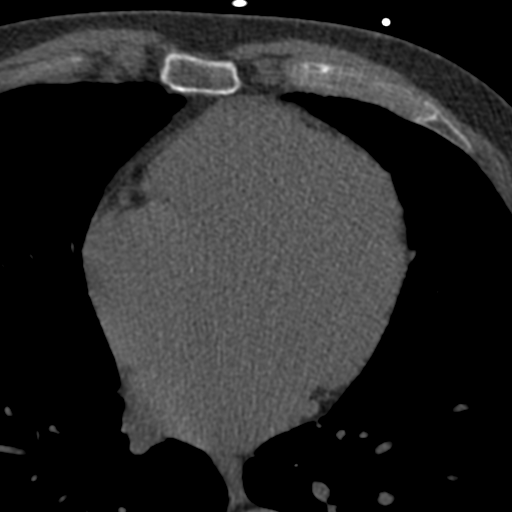
[im 46/76  vessel]
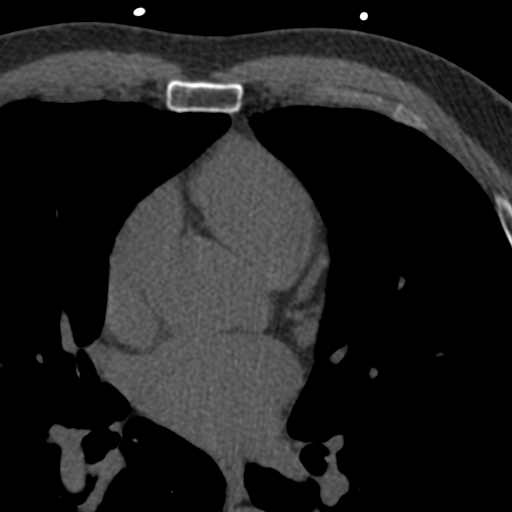
[im 61/76  vessel]
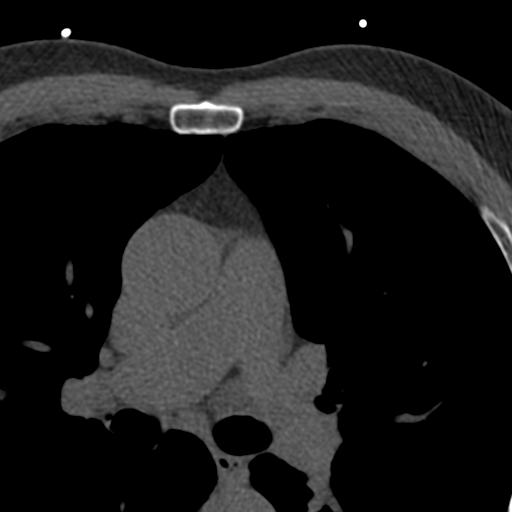

[Series 3: cascseq 2.0 bf37 st · axial · 0.73mm/px · z∈[-260,-160]mm · 5 of 76 slices shown, 7 images]
[im 13/76  vessel]
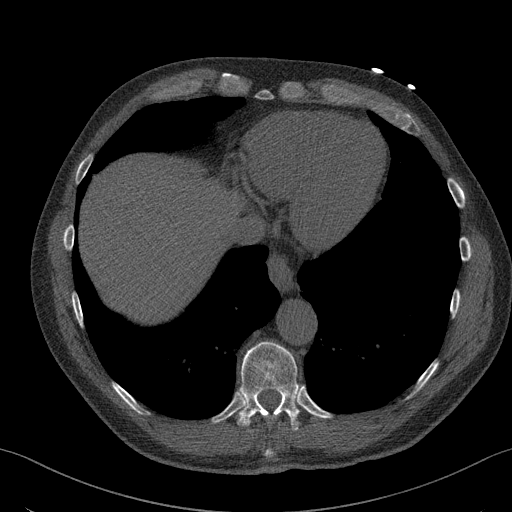
[im 13/76  lung]
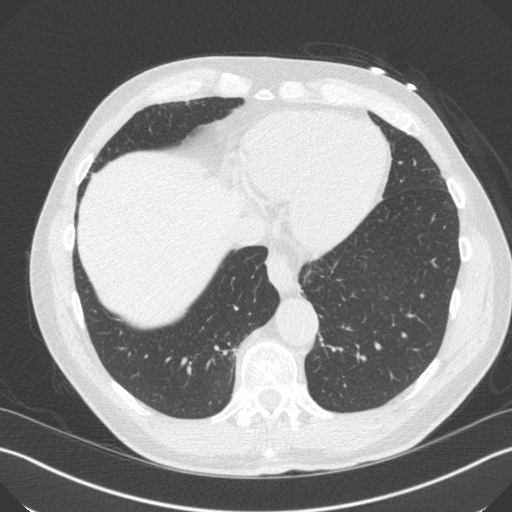
[im 26/76  vessel]
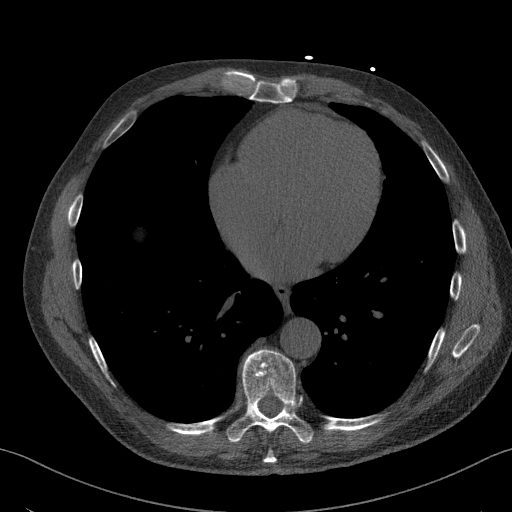
[im 38/76  vessel]
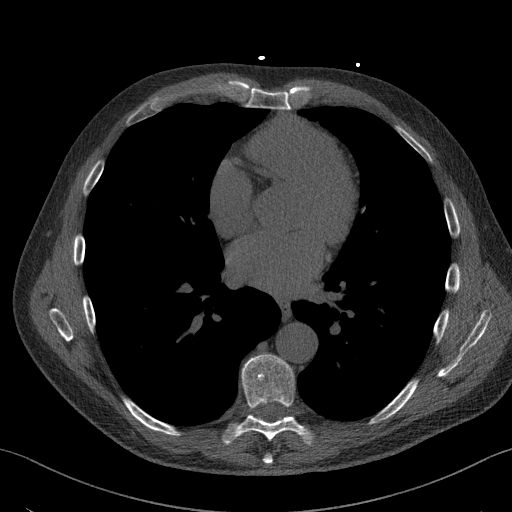
[im 51/76  vessel]
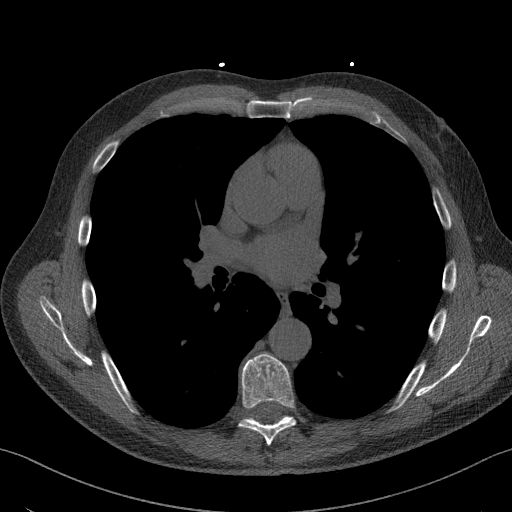
[im 63/76  vessel]
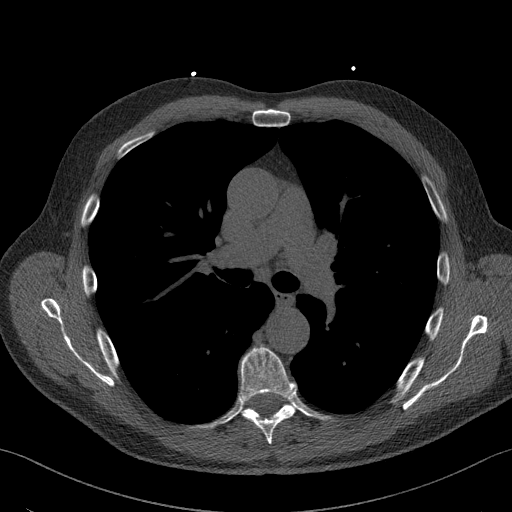
[im 63/76  lung]
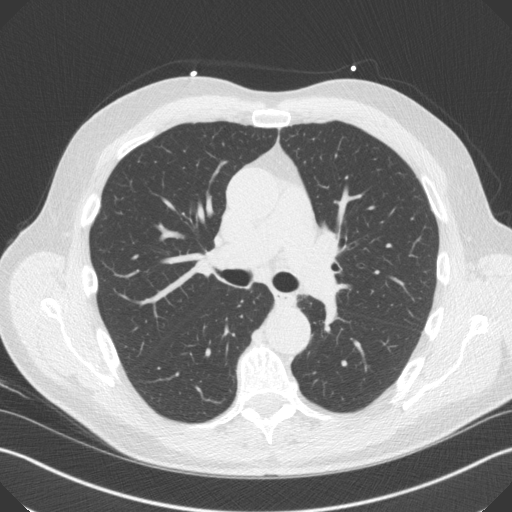

[Series 4: cascseq 2.0 br59 lung · axial · 0.73mm/px · z∈[-260,-160]mm · 5 of 76 slices shown]
[im 13/76  lung]
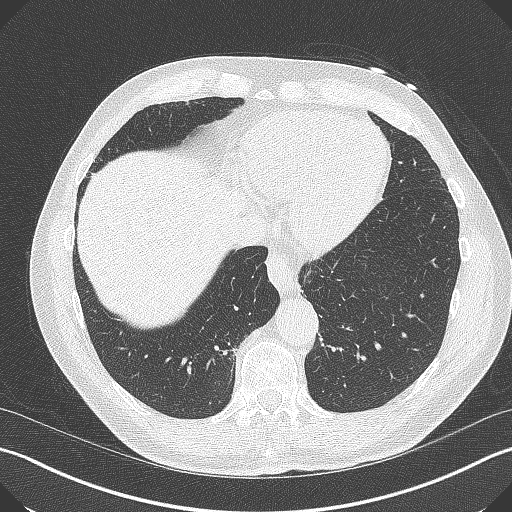
[im 26/76  lung]
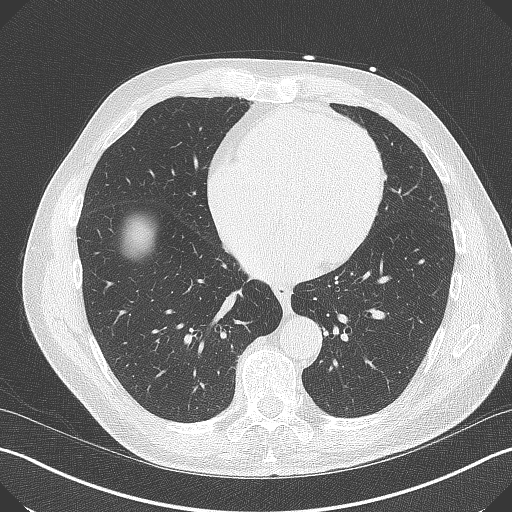
[im 38/76  lung]
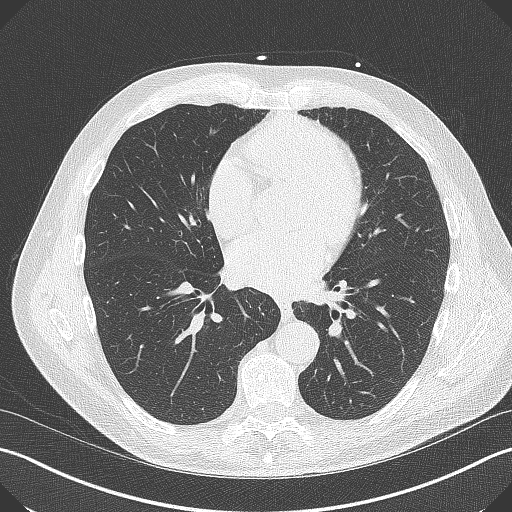
[im 51/76  lung]
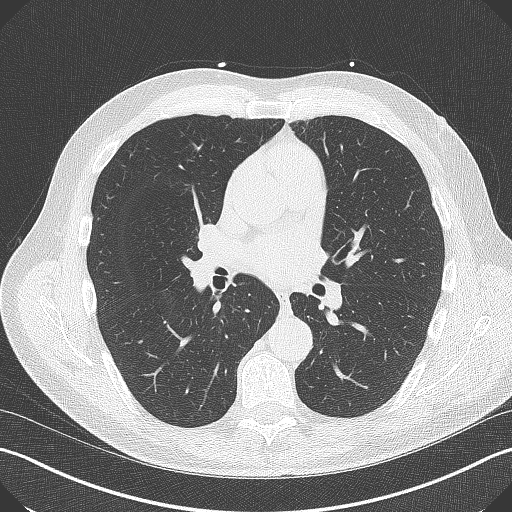
[im 63/76  lung]
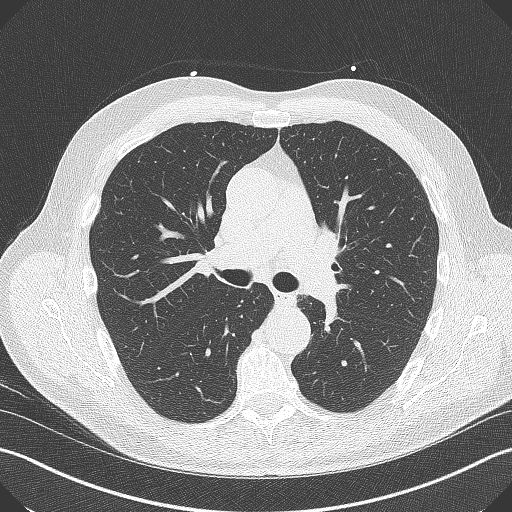

[14 of 20 positions shown; findings below may reference images not displayed]

FINDINGS: Atherosclerotic calcifications in the thoracic aorta. Within the
visualized portions of the thorax there are no suspicious appearing
pulmonary nodules or masses, there is no acute consolidative
airspace disease, no pleural effusions, no pneumothorax and no
lymphadenopathy. Visualized portions of the upper abdomen are
unremarkable. There are no aggressive appearing lytic or blastic
lesions noted in the visualized portions of the skeleton.
IMPRESSION: 1.  Aortic Atherosclerosis (97H70-2CJ.J).
FINDINGS: Coronary arteries: Normal origins.

Coronary Calcium Score:

Left main: 0

Left anterior descending artery: 0

Left circumflex artery: 0

Right coronary artery: 0

Total: 0

Percentile: 0

Pericardium: Normal.

Aorta: Normal to borderline enlarged caliber of ascending aorta.
Significant aortic atherosclerosis noted in aortic arch.

Non-cardiac: See separate report from [REDACTED].
IMPRESSION: Coronary calcium score of 0. This was 0 percentile for age-, race-,
and sex-matched controls. Normal to borderline enlarged caliber of
ascending aorta. Significant aortic atherosclerosis noted in aortic
arch



If CAC=0, it is reasonable to withhold statin therapy and reassess
in 5 to 10 years, as long as higher risk conditions are absent
(diabetes mellitus, family history of premature CHD in first degree
relatives (males <55 years; females <65 years), cigarette smoking,
or LDL >=190 mg/dL).

If CAC is 1 to 99, it is reasonable to initiate statin therapy for
patients >=55 years of age.

If CAC is >=100 or >=75th percentile, it is reasonable to initiate
statin therapy at any age.

Cardiology referral should be considered for patients with CAC
scores >=400 or >=75th percentile.

*0184 AHA/ACC/AACVPR/AAPA/ABC/FRIDTJOV/NOMASIBULELE/YUNTE/[place] Har[place]/ANTONIAMARIA/POHLERS/BIANCHI
Guideline on the Management of Blood Cholesterol: A Report of the
American College of Cardiology/American Heart Association Task Force
on Clinical Practice Guidelines. J Am Coll Cardiol.
2908;73(24):3372-3820.

*** End of Addendum ***
EXAM:
OVER-READ INTERPRETATION  CT CHEST

The following report is an over-read performed by radiologist Dr.
Mircio Sakamoto [REDACTED] on 09/23/2021. This
over-read does not include interpretation of cardiac or coronary
anatomy or pathology. The coronary calcium score interpretation by
the cardiologist is attached.
FINDINGS: Atherosclerotic calcifications in the thoracic aorta. Within the
visualized portions of the thorax there are no suspicious appearing
pulmonary nodules or masses, there is no acute consolidative
airspace disease, no pleural effusions, no pneumothorax and no
lymphadenopathy. Visualized portions of the upper abdomen are
unremarkable. There are no aggressive appearing lytic or blastic
lesions noted in the visualized portions of the skeleton.
IMPRESSION: 1.  Aortic Atherosclerosis (97H70-2CJ.J).

## 2023-11-23 ENCOUNTER — Other Ambulatory Visit (HOSPITAL_COMMUNITY): Payer: Self-pay | Admitting: Internal Medicine

## 2023-11-23 NOTE — Telephone Encounter (Signed)
 Prescription refill request for Eliquis  received. Indication: AF Last office visit: 08/20/23  Calvin Caulk MD Scr: 1.18 on 04/19/23  Epic Age: 73 Weight: 97.5kg  Based on above findings Eliquis  5mg  twice daily is the appropriate dose.  Refill approved.

## 2024-02-16 DIAGNOSIS — H52203 Unspecified astigmatism, bilateral: Secondary | ICD-10-CM | POA: Diagnosis not present

## 2024-02-16 DIAGNOSIS — Z125 Encounter for screening for malignant neoplasm of prostate: Secondary | ICD-10-CM | POA: Diagnosis not present

## 2024-02-16 DIAGNOSIS — K219 Gastro-esophageal reflux disease without esophagitis: Secondary | ICD-10-CM | POA: Diagnosis not present

## 2024-02-16 DIAGNOSIS — Z1389 Encounter for screening for other disorder: Secondary | ICD-10-CM | POA: Diagnosis not present

## 2024-02-16 DIAGNOSIS — Z1212 Encounter for screening for malignant neoplasm of rectum: Secondary | ICD-10-CM | POA: Diagnosis not present

## 2024-02-16 DIAGNOSIS — H2513 Age-related nuclear cataract, bilateral: Secondary | ICD-10-CM | POA: Diagnosis not present

## 2024-02-16 DIAGNOSIS — R7989 Other specified abnormal findings of blood chemistry: Secondary | ICD-10-CM | POA: Diagnosis not present

## 2024-02-18 ENCOUNTER — Other Ambulatory Visit (HOSPITAL_COMMUNITY): Payer: Self-pay | Admitting: Internal Medicine

## 2024-02-18 DIAGNOSIS — K219 Gastro-esophageal reflux disease without esophagitis: Secondary | ICD-10-CM | POA: Diagnosis not present

## 2024-02-23 DIAGNOSIS — I10 Essential (primary) hypertension: Secondary | ICD-10-CM | POA: Diagnosis not present

## 2024-02-23 DIAGNOSIS — R82998 Other abnormal findings in urine: Secondary | ICD-10-CM | POA: Diagnosis not present

## 2024-02-23 DIAGNOSIS — I480 Paroxysmal atrial fibrillation: Secondary | ICD-10-CM | POA: Diagnosis not present

## 2024-02-23 DIAGNOSIS — Z0000 Encounter for general adult medical examination without abnormal findings: Secondary | ICD-10-CM | POA: Diagnosis not present

## 2024-02-23 DIAGNOSIS — D6869 Other thrombophilia: Secondary | ICD-10-CM | POA: Diagnosis not present

## 2024-02-23 DIAGNOSIS — Z1331 Encounter for screening for depression: Secondary | ICD-10-CM | POA: Diagnosis not present

## 2024-02-23 DIAGNOSIS — K219 Gastro-esophageal reflux disease without esophagitis: Secondary | ICD-10-CM | POA: Diagnosis not present

## 2024-02-23 DIAGNOSIS — Z23 Encounter for immunization: Secondary | ICD-10-CM | POA: Diagnosis not present

## 2024-02-23 DIAGNOSIS — Z1339 Encounter for screening examination for other mental health and behavioral disorders: Secondary | ICD-10-CM | POA: Diagnosis not present

## 2024-02-23 DIAGNOSIS — I739 Peripheral vascular disease, unspecified: Secondary | ICD-10-CM | POA: Diagnosis not present

## 2024-02-26 ENCOUNTER — Other Ambulatory Visit (HOSPITAL_COMMUNITY): Payer: Self-pay | Admitting: Internal Medicine

## 2024-03-23 DIAGNOSIS — D1801 Hemangioma of skin and subcutaneous tissue: Secondary | ICD-10-CM | POA: Diagnosis not present

## 2024-03-23 DIAGNOSIS — R229 Localized swelling, mass and lump, unspecified: Secondary | ICD-10-CM | POA: Diagnosis not present

## 2024-03-23 DIAGNOSIS — C44329 Squamous cell carcinoma of skin of other parts of face: Secondary | ICD-10-CM | POA: Diagnosis not present

## 2024-03-23 DIAGNOSIS — L814 Other melanin hyperpigmentation: Secondary | ICD-10-CM | POA: Diagnosis not present

## 2024-03-23 DIAGNOSIS — Z85828 Personal history of other malignant neoplasm of skin: Secondary | ICD-10-CM | POA: Diagnosis not present

## 2024-03-23 DIAGNOSIS — Z08 Encounter for follow-up examination after completed treatment for malignant neoplasm: Secondary | ICD-10-CM | POA: Diagnosis not present

## 2024-03-23 DIAGNOSIS — L570 Actinic keratosis: Secondary | ICD-10-CM | POA: Diagnosis not present

## 2024-03-23 DIAGNOSIS — L821 Other seborrheic keratosis: Secondary | ICD-10-CM | POA: Diagnosis not present

## 2024-04-14 ENCOUNTER — Other Ambulatory Visit (HOSPITAL_COMMUNITY): Payer: Self-pay | Admitting: Internal Medicine

## 2024-05-19 ENCOUNTER — Other Ambulatory Visit: Payer: Self-pay | Admitting: Cardiology

## 2024-07-20 ENCOUNTER — Ambulatory Visit (HOSPITAL_COMMUNITY)
Admission: RE | Admit: 2024-07-20 | Discharge: 2024-07-20 | Disposition: A | Discharge status: Home / Self Care | Source: Ambulatory Visit | Attending: Internal Medicine | Admitting: Internal Medicine

## 2024-07-20 VITALS — BP 120/70 | HR 65 | Wt 218.0 lb

## 2024-07-20 DIAGNOSIS — I340 Nonrheumatic mitral (valve) insufficiency: Secondary | ICD-10-CM | POA: Insufficient documentation

## 2024-07-20 DIAGNOSIS — I5020 Unspecified systolic (congestive) heart failure: Secondary | ICD-10-CM | POA: Diagnosis not present

## 2024-07-20 DIAGNOSIS — Z7901 Long term (current) use of anticoagulants: Secondary | ICD-10-CM | POA: Diagnosis not present

## 2024-07-20 DIAGNOSIS — Z79899 Other long term (current) drug therapy: Secondary | ICD-10-CM | POA: Diagnosis not present

## 2024-07-20 DIAGNOSIS — I5022 Chronic systolic (congestive) heart failure: Secondary | ICD-10-CM | POA: Diagnosis not present

## 2024-07-20 DIAGNOSIS — I4819 Other persistent atrial fibrillation: Secondary | ICD-10-CM | POA: Diagnosis present

## 2024-07-20 DIAGNOSIS — I517 Cardiomegaly: Secondary | ICD-10-CM | POA: Diagnosis not present

## 2024-07-20 DIAGNOSIS — G4733 Obstructive sleep apnea (adult) (pediatric): Secondary | ICD-10-CM | POA: Diagnosis not present

## 2024-07-20 DIAGNOSIS — I429 Cardiomyopathy, unspecified: Secondary | ICD-10-CM | POA: Diagnosis not present

## 2024-07-20 DIAGNOSIS — Z7984 Long term (current) use of oral hypoglycemic drugs: Secondary | ICD-10-CM | POA: Diagnosis not present

## 2024-07-20 DIAGNOSIS — I480 Paroxysmal atrial fibrillation: Secondary | ICD-10-CM

## 2024-07-20 NOTE — Patient Instructions (Addendum)
 Good to see you today!   Your physician recommends that you schedule a follow-up appointment 1 year(January 2027) Call office in November 2024 to schedule an appointment  If you have any questions or concerns before your next appointment please send us  a message through [place] or call our office at [phone].    TO LEAVE A MESSAGE FOR THE NURSE SELECT OPTION 2, PLEASE LEAVE A MESSAGE INCLUDING: YOUR NAME DATE OF BIRTH CALL BACK NUMBER REASON FOR CALL**this is important as we prioritize the call backs  YOU WILL RECEIVE A CALL BACK THE SAME DAY AS LONG AS YOU CALL BEFORE 4:00 PM At the [facility], you and your health needs are our priority. As part of our continuing mission to provide you with exceptional heart care, we have created designated Provider Care Teams. These Care Teams include your primary Cardiologist (physician) and Advanced Practice Providers (APPs- Physician Assistants and Nurse Practitioners) who all work together to provide you with the care you need, when you need it.   You may see any of the following providers on your designated Care Team at your next follow up: Dr Toribio Fuel Dr Ezra Shuck Dr. Morene Brownie Greig Mosses, NP Caffie Shed, [place] [facility] [place], [place] Beckey Coe, NP Jordan Lee, NP Ellouise Class, NP Tinnie Redman, PharmD Jaun Bash, PharmD   Please be sure to bring in all your medications bottles to every appointment.    Thank you for choosing [facility]

## 2024-07-20 NOTE — Progress Notes (Signed)
 "  ADVANCED HF CLINIC  NOTE  Referring Physician: Shepard Ade, MD Primary Care: Shepard Ade, MD Primary Cardiologist: Dr. Court (formerly Esmeralda Sharps. MD)  HPI:  Joseph Henderson is a 74 y.o.male with h/o atrial fibrillation and cardiomyopathy who is referred by Dr. Court for further evaluation of his cardiomyopathy.   He has no h/o cardiac risk factors and has been very active -= played minor league baseball. CAC = 0 in 8/23  He was found to be in A-fib by a insurance nurse in 6/24. He was referred to Cardiology. He was noted to be in AF with RVR. Started on metoprolol  and Eliquis . Referred to EP and echo ordered  Echo 01/11/23 showed EF 30-35% RV moderately enlarged/moderate HK with severe biatrial enlargement.   Seen in AF Clinic on 02/09/23 and was still in AF with RVR at rate 117.   He underwent successful DC cardioversion by Dr. Shlomo on 02/10/23   Was seen back in AF clinic on 03/03/23 and was back in AF (based on AppleWatch AF returned on 03/02/23). Amiodarone  started on 03/15/23 and referred for ablation.   Underwent AF ablation on 05/05/23. Amio stopped on 07/04/23  cMRI 11/24: LVEF 59% no LGE. Normal ECV. RV upper normal size. EF 54%. Severe biatrial enlargement  Moderate mitral regurgitation by regurgitant fraction 29%.  Sleep study AHI 8.2. Saw Dr. Shlomo   Here for routine follow up. Doing great. Working with systems analyst. Walks 3-4 miles per day and doing elliptical. NO SOB, palpitations. No bleeding with Eliquis .   PMHx: as per HPI  Current Outpatient Medications  Medication Sig Dispense Refill   aspirin EC 81 MG tablet Take 81 mg by mouth every evening. Swallow whole.     Cetirizine HCl (ZYRTEC ALLERGY) 10 MG CAPS Take 10 mg by mouth every evening.     ELIQUIS  5 MG TABS tablet TAKE 1 TABLET BY MOUTH TWICE A DAY 180 tablet 1   FARXIGA  10 MG TABS tablet TAKE 1 TABLET BY MOUTH DAILY BEFORE BREAKFAST. 30 tablet 11   fluorouracil (EFUDEX) 5 % cream Apply 1  Application topically 2 (two) times daily as needed (precancerous spots).     fluticasone (KLS ALLER-FLO) 50 MCG/ACT nasal spray Place 2 sprays into both nostrils daily as needed for allergies or rhinitis.     Glucosamine-Chondroit-Vit C-Mn (GLUCOSAMINE CHONDR 1500 COMPLX PO) Take 1 tablet by mouth 2 (two) times daily.     ibuprofen (ADVIL) 200 MG tablet Take 400 mg by mouth as needed for moderate pain (pain score 4-6).     Linoleic Acid Conjugated 1200 MG CAPS Take 2,400 mg by mouth daily.     losartan  (COZAAR ) 25 MG tablet TAKE 1 TABLET (25 MG TOTAL) BY MOUTH DAILY. 90 tablet 3   melatonin 5 MG TABS Take 5 mg by mouth at bedtime.     metoprolol  succinate (TOPROL -XL) 50 MG 24 hr tablet TAKE 1 TABLET BY MOUTH DAILY. TAKE WITH OR IMMEDIATELY FOLLOWING A MEAL. 90 tablet 3   Multiple Vitamin (MULTIVITAMIN ADULT) TABS Take 1 tablet by mouth every evening.     omeprazole (PRILOSEC) 20 MG capsule Take 20 mg by mouth every morning.     oxymetazoline (AFRIN) 0.05 % nasal spray Place 1 spray into both nostrils 2 (two) times daily as needed for congestion.     TURMERIC CURCUMIN PO Take 1,000 mg by mouth daily.     No current facility-administered medications for this encounter.    Allergies  Allergen Reactions  Amoxicillin Hives and Rash      Social History   Socioeconomic History   Marital status: Married    Spouse name: Not on file   Number of children: Not on file   Years of education: Not on file   Highest education level: Not on file  Occupational History   Not on file  Tobacco Use   Smoking status: Never   Smokeless tobacco: Never   Tobacco comments:    Never smoked 02/09/23  Substance and Sexual Activity   Alcohol  use: Yes    Alcohol /week: 8.0 standard drinks of alcohol     Types: 8 Glasses of wine per week    Comment: 4 glasses of wine or mixed drinks a week 02/09/23   Drug use: No   Sexual activity: Not on file  Other Topics Concern   Not on file  Social History Narrative    Not on file   Social Drivers of Health   Tobacco Use: Low Risk (08/20/2023)   Patient History    Smoking Tobacco Use: Never    Smokeless Tobacco Use: Never    Passive Exposure: Not on file  Financial Resource Strain: Not on file  Food Insecurity: Not on file  Transportation Needs: Not on file  Physical Activity: Not on file  Stress: Not on file  Social Connections: Not on file  Intimate Partner Violence: Not on file  Depression (EYV7-0): Not on file  Alcohol  Screen: Not on file  Housing: Not on file  Utilities: Not on file  Health Literacy: Not on file     No family history on file.  Vitals:   07/20/24 0930  BP: 120/70  Pulse: 65  SpO2: 95%  Weight: 98.9 kg (218 lb)     PHYSICAL EXAM: General:  Sitting up No resp difficulty HEENT: normal Neck: supple. no JVD.  Cor: Regular rate & rhythm. No rubs, gallops or murmurs. Lungs: clear Abdomen: soft, nontender, nondistended.Good bowel sounds. Extremities: no cyanosis, clubbing, rash, edema Neuro: alert & orientedx3, cranial nerves grossly intact. moves all 4 extremities w/o difficulty. Affect pleasant  ECG NSR 66 No ST-T wave abnormalities.     ASSESSMENT & PLAN:  1. Chronic systolic HF  with recovered EF - Echo 01/11/23 EF 30-35% RV moderately enlarged/HK. 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 Normal ECV. RV upper normal size. EF 54%. Severe biatrial enlargement  Moderate mitral regurgitation by regurgitant fraction 29%. - Echo 2/25 EF 55% moderate BAE . Doing well. HF essentially resolved - Continue Toprol  50 - Continue losartan  25 - Continue Farxiga   2. Persistent AF - discovered incidentally in 5/24 - s/p DC-CV on 02/10/23 with recurrence  of AF 03/03/23 - Followed by AF Clinic. Amio started 03/15/23 - Underwent AF ablation on 05/05/23. Amio stopped 07/04/23 - On Eliquis  5 bid. No bleeding - Remains in NSR - CHADSvasc2 = 2.9% risk - Long discussion about recent OCEAN trail and possibility of stopping Eliquis . We discussed pros/cons including risk of stroke vs bleed and risk of recurrence. He will continue for now. Stop ASA  3. Mild OSA - AHI 8.2  - Has lost weight. Suspect improved   Toribio Fuel, MD  9:40 AM  "
